# Patient Record
Sex: Female | Born: 1965 | Race: Black or African American | Hispanic: No | Marital: Single | Smoking: Never smoker
Health system: Southern US, Community
[De-identification: ages and names within clinical notes are randomized; demographics above are authoritative.]

## PROBLEM LIST (undated history)

## (undated) DIAGNOSIS — M109 Gout, unspecified: Secondary | ICD-10-CM

## (undated) DIAGNOSIS — I10 Essential (primary) hypertension: Secondary | ICD-10-CM

---

## 2014-10-19 ENCOUNTER — Emergency Department (HOSPITAL_BASED_OUTPATIENT_CLINIC_OR_DEPARTMENT_OTHER): Payer: BLUE CROSS/BLUE SHIELD

## 2014-10-19 ENCOUNTER — Encounter (HOSPITAL_BASED_OUTPATIENT_CLINIC_OR_DEPARTMENT_OTHER): Payer: Self-pay

## 2014-10-19 ENCOUNTER — Emergency Department (HOSPITAL_BASED_OUTPATIENT_CLINIC_OR_DEPARTMENT_OTHER)
Admission: EM | Admit: 2014-10-19 | Discharge: 2014-10-19 | Disposition: A | Discharge status: Home / Self Care | Payer: BLUE CROSS/BLUE SHIELD | Attending: Emergency Medicine | Admitting: Emergency Medicine

## 2014-10-19 DIAGNOSIS — Y9389 Activity, other specified: Secondary | ICD-10-CM | POA: Diagnosis not present

## 2014-10-19 DIAGNOSIS — S60222A Contusion of left hand, initial encounter: Secondary | ICD-10-CM | POA: Diagnosis not present

## 2014-10-19 DIAGNOSIS — Y9289 Other specified places as the place of occurrence of the external cause: Secondary | ICD-10-CM | POA: Diagnosis not present

## 2014-10-19 DIAGNOSIS — W500XXA Accidental hit or strike by another person, initial encounter: Secondary | ICD-10-CM | POA: Insufficient documentation

## 2014-10-19 DIAGNOSIS — S6992XA Unspecified injury of left wrist, hand and finger(s), initial encounter: Secondary | ICD-10-CM | POA: Diagnosis present

## 2014-10-19 DIAGNOSIS — Y998 Other external cause status: Secondary | ICD-10-CM | POA: Insufficient documentation

## 2014-10-19 MED ORDER — NAPROXEN 500 MG PO TABS: 500.0000 mg | ORAL_TABLET | Freq: Two times a day (BID) | ORAL | Status: DC

## 2014-10-19 NOTE — Discharge Instructions (Signed)
Keep ace wrap on for swelling and for support. Ice your hand several times a day. Elevate. Naprosyn for pain and inflammation. Follow up with your doctor if not improving.   Hand Contusion A hand contusion is a deep bruise on your hand area. Contusions are the result of an injury that caused bleeding under the skin. The contusion may turn blue, purple, or yellow. Minor injuries will give you a painless contusion, but more severe contusions may stay painful and swollen for a few weeks. CAUSES  A contusion is usually caused by a blow, trauma, or direct force to an area of the body. SYMPTOMS   Swelling and redness of the injured area.  Discoloration of the injured area.  Tenderness and soreness of the injured area.  Pain. DIAGNOSIS  The diagnosis can be made by taking a history and performing a physical exam. An X-ray, CT scan, or MRI may be needed to determine if there were any associated injuries, such as broken bones (fractures). TREATMENT  Often, the best treatment for a hand contusion is resting, elevating, icing, and applying cold compresses to the injured area. Over-the-counter medicines may also be recommended for pain control. HOME CARE INSTRUCTIONS   Put ice on the injured area.  Put ice in a plastic bag.  Place a towel between your skin and the bag.  Leave the ice on for 15-20 minutes, 03-04 times a day.  Only take over-the-counter or prescription medicines as directed by your caregiver. Your caregiver may recommend avoiding anti-inflammatory medicines (aspirin, ibuprofen, and naproxen) for 48 hours because these medicines may increase bruising.  If told, use an elastic wrap as directed. This can help reduce swelling. You may remove the wrap for sleeping, showering, and bathing. If your fingers become numb, cold, or blue, take the wrap off and reapply it more loosely.  Elevate your hand with pillows to reduce swelling.  Avoid overusing your hand if it is painful. SEEK  IMMEDIATE MEDICAL CARE IF:   You have increased redness, swelling, or pain in your hand.  Your swelling or pain is not relieved with medicines.  You have loss of feeling in your hand or are unable to move your fingers.  Your hand turns cold or blue.  You have pain when you move your fingers.  Your hand becomes warm to the touch.  Your contusion does not improve in 2 days. MAKE SURE YOU:   Understand these instructions.  Will watch your condition.  Will get help right away if you are not doing well or get worse. Document Released: 12/25/2001 Document Revised: 03/29/2012 Document Reviewed: 12/27/2011 [facility] Patient Information 2015 [place], [place]. This information is not intended to replace advice given to you by your health care provider. Make sure you discuss any questions you have with your health care provider.
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
## 2015-12-19 ENCOUNTER — Encounter (HOSPITAL_BASED_OUTPATIENT_CLINIC_OR_DEPARTMENT_OTHER): Payer: Self-pay | Admitting: *Deleted

## 2015-12-19 ENCOUNTER — Emergency Department (HOSPITAL_BASED_OUTPATIENT_CLINIC_OR_DEPARTMENT_OTHER): Payer: BLUE CROSS/BLUE SHIELD

## 2015-12-19 ENCOUNTER — Emergency Department (HOSPITAL_BASED_OUTPATIENT_CLINIC_OR_DEPARTMENT_OTHER)
Admission: EM | Admit: 2015-12-19 | Discharge: 2015-12-19 | Disposition: A | Discharge status: Home / Self Care | Payer: BLUE CROSS/BLUE SHIELD | Attending: Emergency Medicine | Admitting: Emergency Medicine

## 2015-12-19 DIAGNOSIS — R05 Cough: Secondary | ICD-10-CM

## 2015-12-19 DIAGNOSIS — R059 Cough, unspecified: Secondary | ICD-10-CM

## 2015-12-19 DIAGNOSIS — J040 Acute laryngitis: Secondary | ICD-10-CM | POA: Insufficient documentation

## 2015-12-19 LAB — CBC WITH DIFFERENTIAL/PLATELET
Basophils Absolute: 0 10*3/uL (ref 0.0–0.1)
Basophils Relative: 0 %
EOS PCT: 2 %
Eosinophils Absolute: 0.1 10*3/uL (ref 0.0–0.7)
HCT: 36.7 % (ref 36.0–46.0)
HEMOGLOBIN: 12.5 g/dL (ref 12.0–15.0)
LYMPHS ABS: 2.9 10*3/uL (ref 0.7–4.0)
LYMPHS PCT: 47 %
MCH: 31.8 pg (ref 26.0–34.0)
MCHC: 34.1 g/dL (ref 30.0–36.0)
MCV: 93.4 fL (ref 78.0–100.0)
Monocytes Absolute: 0.6 10*3/uL (ref 0.1–1.0)
Monocytes Relative: 10 %
Neutro Abs: 2.5 10*3/uL (ref 1.7–7.7)
Neutrophils Relative %: 41 %
PLATELETS: 251 10*3/uL (ref 150–400)
RBC: 3.93 MIL/uL (ref 3.87–5.11)
RDW: 13.1 % (ref 11.5–15.5)
WBC: 6 10*3/uL (ref 4.0–10.5)

## 2015-12-19 LAB — BASIC METABOLIC PANEL
Anion gap: 7 (ref 5–15)
BUN: 8 mg/dL (ref 6–20)
CHLORIDE: 105 mmol/L (ref 101–111)
CO2: 26 mmol/L (ref 22–32)
Calcium: 8.2 mg/dL — ABNORMAL LOW (ref 8.9–10.3)
Creatinine, Ser: 0.9 mg/dL (ref 0.44–1.00)
GFR calc Af Amer: >60 mL/min (ref >60)
GFR calc non Af Amer: >60 mL/min (ref >60)
Glucose, Bld: 97 mg/dL (ref 65–99)
POTASSIUM: 3.1 mmol/L — AB (ref 3.5–5.1)
Sodium: 138 mmol/L (ref 135–145)

## 2015-12-19 LAB — BRAIN NATRIURETIC PEPTIDE: B Natriuretic Peptide: 22.2 pg/mL (ref 0.0–100.0)

## 2015-12-19 MED ORDER — POTASSIUM CHLORIDE CRYS ER 20 MEQ PO TBCR
40.0000 meq | EXTENDED_RELEASE_TABLET | Freq: Once | ORAL | Status: AC
Start: 2015-12-19 — End: 2015-12-19
  Administered 2015-12-19: 40 meq via ORAL
  Filled 2015-12-19: qty 2

## 2015-12-19 MED ORDER — DEXAMETHASONE SODIUM PHOSPHATE 10 MG/ML IJ SOLN
10.0000 mg | Freq: Once | INTRAMUSCULAR | Status: AC
  Administered 2015-12-19: 10 mg via INTRAMUSCULAR
  Filled 2015-12-19: qty 1

## 2015-12-19 NOTE — Discharge Instructions (Signed)
Laryngitis Laryngitis is inflammation of your vocal cords. This causes hoarseness, coughing, loss of voice, sore throat, or a dry throat. Your vocal cords are two bands of muscles that are found in your throat. When you speak, these cords come together and vibrate. These vibrations come out through your mouth as sound. When your vocal cords are inflamed, your voice sounds different. Laryngitis can be temporary (acute) or long-term (chronic). Most cases of acute laryngitis improve with time. Chronic laryngitis is laryngitis that lasts for more than three weeks. CAUSES Acute laryngitis may be caused by:  A viral infection.  Lots of talking, yelling, or singing. This is also called vocal strain.  Bacterial infections. Chronic laryngitis may be caused by:  Vocal strain.  Injury to your vocal cords.  Acid reflux (gastroesophageal reflux disease or GERD).  Allergies.  Sinus infection.  Smoking.  Alcohol abuse.  Breathing in chemicals or dust.  Growths on the vocal cords. RISK FACTORS Risk factors for laryngitis include:  Smoking.  Alcohol abuse.  Having allergies. SIGNS AND SYMPTOMS Symptoms of laryngitis may include:  Low, hoarse voice.  Loss of voice.  Dry cough.  Sore throat.  Stuffy nose. DIAGNOSIS Laryngitis may be diagnosed by:  Physical exam.  Throat culture.  Blood test.  Laryngoscopy. This procedure allows your health care provider to look at your vocal cords with a mirror or viewing tube. TREATMENT Treatment for laryngitis depends on what is causing it. Usually, treatment involves resting your voice and using medicines to soothe your throat. However, if your laryngitis is caused by a bacterial infection, you may need to take antibiotic medicine. If your laryngitis is caused by a growth, you may need to have a procedure to remove it. HOME CARE INSTRUCTIONS  Drink enough fluid to keep your urine clear or pale yellow.  Breathe in moist air. Use a  humidifier if you live in a dry climate.  Take medicines only as directed by your health care provider.  If you were prescribed an antibiotic medicine, finish it all even if you start to feel better.  Do not smoke cigarettes or electronic cigarettes. If you need help quitting, ask your health care provider.  Talk as little as possible. Also avoid whispering, which can cause vocal strain.  Write instead of talking. Do this until your voice is back to normal. SEEK MEDICAL CARE IF:  You have a fever.  You have increasing pain.  You have difficulty swallowing. SEEK IMMEDIATE MEDICAL CARE IF:  You cough up blood.  You have trouble breathing.   This information is not intended to replace advice given to you by your health care provider. Make sure you discuss any questions you have with your health care provider.   Document Released: 07/05/2005 Document Revised: 07/26/2014 Document Reviewed: 12/18/2013 Elsevier Interactive Patient Education 2016 Elsevier Inc.    Cough, Adult Coughing is a reflex that clears your throat and your airways. Coughing helps to heal and protect your lungs. It is normal to cough occasionally, but a cough that happens with other symptoms or lasts a long time may be a sign of a condition that needs treatment. A cough may last only 2-3 weeks (acute), or it may last longer than 8 weeks (chronic). CAUSES Coughing is commonly caused by:  Breathing in substances that irritate your lungs.  A viral or bacterial respiratory infection.  Allergies.  Asthma.  Postnasal drip.  Smoking.  Acid backing up from the stomach into the esophagus (gastroesophageal reflux).  Certain medicines.    Chronic lung problems, including COPD (or rarely, lung cancer).  Other medical conditions such as heart failure. HOME CARE INSTRUCTIONS  Pay attention to any changes in your symptoms. Take these actions to help with your discomfort:  Take medicines only as told by your  health care provider.  If you were prescribed an antibiotic medicine, take it as told by your health care provider. Do not stop taking the antibiotic even if you start to feel better.  Talk with your health care provider before you take a cough suppressant medicine.  Drink enough fluid to keep your urine clear or pale yellow.  If the air is dry, use a cold steam vaporizer or humidifier in your bedroom or your home to help loosen secretions.  Avoid anything that causes you to cough at work or at home.  If your cough is worse at night, try sleeping in a semi-upright position.  Avoid cigarette smoke. If you smoke, quit smoking. If you need help quitting, ask your health care provider.  Avoid caffeine.  Avoid alcohol.  Rest as needed. SEEK MEDICAL CARE IF:   You have new symptoms.  You cough up pus.  Your cough does not get better after 2-3 weeks, or your cough gets worse.  You cannot control your cough with suppressant medicines and you are losing sleep.  You develop pain that is getting worse or pain that is not controlled with pain medicines.  You have a fever.  You have unexplained weight loss.  You have night sweats. SEEK IMMEDIATE MEDICAL CARE IF:  You cough up blood.  You have difficulty breathing.  Your heartbeat is very fast.   This information is not intended to replace advice given to you by your health care provider. Make sure you discuss any questions you have with your health care provider.   Document Released: 01/01/2011 Document Revised: 03/26/2015 Document Reviewed: 09/11/2014 Elsevier Interactive Patient Education 2016 Elsevier Inc.  

## 2015-12-19 NOTE — ED Notes (Signed)
Cough and laryngitis.

## 2015-12-19 NOTE — ED Notes (Signed)
Pt tearful discussing recent death of husband ( 9 months ago)and sudden death of mother in law this week and its impact in her health . She reports feeling tearful, depressed and lacking in support. Pt reports attending hospice meeting that do not help. Pt is a [employer], discussed an action plan to seek counseling from her church,keeping a  journal and leaning on support of friends and family.

## 2015-12-19 NOTE — ED Provider Notes (Signed)
CSN: [id number]     Arrival date & time 12/19/15  1746 History   First MD Initiated Contact with Patient 12/19/15 1810     Chief Complaint  Patient presents with  . Cough   HPI  Ms. Kaitlin Haney is a 50 year old female presenting with cough and hoarseness. Pt reports onset of symptoms approximately 1 week ago. She complains of a hoarse voice without sore throat. Denies pain on swallowing, difficulty swallowing or difficulty breathing. She also complains of a productive cough with yellow phlegm. She states that the cough was dry at first before developing sputum. The cough appeared first and she believes her frequent coughing caused the hoarseness. She endorses associated clear rhinorrhea. She has not tried any OTC medications. She has tried voice rest for her throat. 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 Denies sick contacts. Denies fevers, chills, headaches, ear pain, eye discharge, nasal congestion, sinus pressure, sore throat, neck pain, chest pain, SOB, abdominal pain, nausea or vomiting.   History reviewed. No pertinent past medical history. History reviewed. No pertinent past surgical history. No family history on file. Social History  Substance Use Topics  . Smoking status: Never Smoker   . Smokeless tobacco: None  . Alcohol Use: Yes   OB History    No data available     Review of Systems  All other systems reviewed and are negative.     Allergies  Review of patient's allergies indicates no known allergies.  Home Medications   Prior to Admission medications   Medication Sig Start Date End Date Taking? Authorizing Provider  naproxen (NAPROSYN) 500 MG tablet Take 1 tablet (500 mg total) by mouth 2 (two) times daily. 10/19/14   Tatyana Kirichenko, PA-C   BP 174/89 mmHg  Pulse 65  Temp(Src) 98.6 F (37 C) (Oral)  Resp 14  Ht 5\' 9"  (1.753 m)  Wt 136.079 kg  BMI 44.28 kg/m2  SpO2 100%  LMP 12/14/2015 Physical Exam   Constitutional: She appears well-developed and well-nourished. No distress.  Nontoxic appearing  HENT:  Head: Normocephalic and atraumatic.  Nose: No mucosal edema or rhinorrhea.  Mouth/Throat: Uvula is midline, oropharynx is clear and moist and mucous membranes are normal. No oropharyngeal exudate, posterior oropharyngeal edema or posterior oropharyngeal erythema.  Hoarse  Eyes: Conjunctivae are normal. Right eye exhibits no discharge. Left eye exhibits no discharge. No scleral icterus.  Neck: Normal range of motion. Neck supple.  FROM intact. No cervical adenopathy.    Cardiovascular: Normal rate, regular rhythm and normal heart sounds.   Pulmonary/Chest: Effort normal and breath sounds normal. No stridor. No respiratory distress. She has no wheezes. She has no rales.  Musculoskeletal: Normal range of motion.  Lymphadenopathy:    She has no cervical adenopathy.  Neurological: She is alert. Coordination normal.  Skin: Skin is warm and dry.  Psychiatric: She has a normal mood and affect. Her behavior is normal.  Nursing note and vitals reviewed.   ED Course  Procedures (including critical care time) Labs Review Labs Reviewed  BASIC METABOLIC PANEL - Abnormal; Notable for the following:    Potassium 3.1 (*)    Calcium 8.2 (*)    All other components within normal limits  CBC WITH DIFFERENTIAL/PLATELET  BRAIN NATRIURETIC PEPTIDE    Imaging Review Dg Chest 2 View  12/19/2015  CLINICAL DATA:  Acute onset of dry hacking cough. Initial encounter. EXAM: CHEST  2 VIEW COMPARISON:  None. FINDINGS: The  lungs are well-aerated. Vascular congestion is noted, with minimal bilateral atelectasis. There is no evidence of pleural effusion or pneumothorax. The heart is normal in size; the mediastinal contour is within normal limits. No acute osseous abnormalities are seen. IMPRESSION: Vascular congestion, with minimal bilateral atelectasis. Electronically Signed   By: Roanna Raider M.D.   On:  12/19/2015 18:48   I have personally reviewed and evaluated these images and lab results as part of my medical decision-making.   EKG Interpretation None      MDM   Final diagnoses:  Cough  Laryngitis   50 year old female presenting with cough and hoarseness x 1 week. Afebrile and hemodynamically stable. Nontoxic appearing. No oropharyngeal erythema, edema or exudate. No cervical adenopathy. Lungs CTAB. CXR with vascular congestion. Pt denies personal hx of CHF or heart disease. Does note that mother has a pacemaker. Given dry cough and congestion and lack of PCP; further work up indicated before discharge.  Screening blood work unremarkable. Potassium 3.1 which was repleted in ED. BNP WNL. ECG non-ischemic. Presentation likely URI with laryngitis. Decadron given IM. Discussed symptomatic care with patient. Strongly encouraged pt to establish care with PCP.  At this time there does not appear to be any evidence of an acute emergency medical condition and the patient appears stable for discharge with appropriate outpatient follow up. Diagnosis was discussed with patient who verbalizes understanding and is agreeable to discharge. Pt case discussed with Dr. Clayborne Dana who agrees with my plan. Return precautions given in discharge paperwork and discussed with pt at bedside. Pt is stable for discharge.     Rolm Gala Montavis Schubring, PA-C 12/19/15 2204  Marily Memos, MD 12/19/15 [phone]

## 2019-05-18 ENCOUNTER — Other Ambulatory Visit: Payer: Self-pay

## 2019-05-18 DIAGNOSIS — Z20822 Contact with and (suspected) exposure to covid-19: Secondary | ICD-10-CM

## 2019-05-20 ENCOUNTER — Telehealth: Payer: Self-pay

## 2019-05-20 LAB — NOVEL CORONAVIRUS, NAA: SARS-CoV-2, NAA: NOT DETECTED

## 2019-05-20 NOTE — Telephone Encounter (Signed)
Patient called in requesting MyChart setup assistance and COVID 19 lab results - DOB/Address verified - results pending, explained testing process, assisted with MyChart setup, no further questions.

## 2019-07-07 ENCOUNTER — Emergency Department (HOSPITAL_BASED_OUTPATIENT_CLINIC_OR_DEPARTMENT_OTHER)
Admission: EM | Admit: 2019-07-07 | Discharge: 2019-07-07 | Disposition: A | Discharge status: Home / Self Care | Payer: BLUE CROSS/BLUE SHIELD | Attending: Emergency Medicine | Admitting: Emergency Medicine

## 2019-07-07 ENCOUNTER — Other Ambulatory Visit: Payer: Self-pay

## 2019-07-07 ENCOUNTER — Encounter (HOSPITAL_BASED_OUTPATIENT_CLINIC_OR_DEPARTMENT_OTHER): Payer: Self-pay | Admitting: Emergency Medicine

## 2019-07-07 DIAGNOSIS — I10 Essential (primary) hypertension: Secondary | ICD-10-CM

## 2019-07-07 DIAGNOSIS — R42 Dizziness and giddiness: Secondary | ICD-10-CM | POA: Insufficient documentation

## 2019-07-07 HISTORY — DX: Essential (primary) hypertension: I10

## 2019-07-07 LAB — BASIC METABOLIC PANEL
Anion gap: 10 (ref 5–15)
BUN: 11 mg/dL (ref 6–20)
CO2: 25 mmol/L (ref 22–32)
Calcium: 8.6 mg/dL — ABNORMAL LOW (ref 8.9–10.3)
Chloride: 104 mmol/L (ref 98–111)
Creatinine, Ser: 0.92 mg/dL (ref 0.44–1.00)
GFR calc Af Amer: >60 mL/min (ref >60)
GFR calc non Af Amer: >60 mL/min (ref >60)
Glucose, Bld: 105 mg/dL — ABNORMAL HIGH (ref 70–99)
Potassium: 3.8 mmol/L (ref 3.5–5.1)
Sodium: 139 mmol/L (ref 135–145)

## 2019-07-07 LAB — URINALYSIS, ROUTINE W REFLEX MICROSCOPIC
Bilirubin Urine: NEGATIVE
Glucose, UA: NEGATIVE mg/dL
Hgb urine dipstick: NEGATIVE
Ketones, ur: NEGATIVE mg/dL
Leukocytes,Ua: NEGATIVE
Nitrite: NEGATIVE
Protein, ur: NEGATIVE mg/dL
Specific Gravity, Urine: >1.03 — ABNORMAL HIGH (ref 1.005–1.030)
pH: 6 (ref 5.0–8.0)

## 2019-07-07 LAB — CBC
HCT: 38.7 % (ref 36.0–46.0)
Hemoglobin: 12.7 g/dL (ref 12.0–15.0)
MCH: 31.4 pg (ref 26.0–34.0)
MCHC: 32.8 g/dL (ref 30.0–36.0)
MCV: 95.6 fL (ref 80.0–100.0)
Platelets: 272 10*3/uL (ref 150–400)
RBC: 4.05 MIL/uL (ref 3.87–5.11)
RDW: 14.3 % (ref 11.5–15.5)
WBC: 7 10*3/uL (ref 4.0–10.5)
nRBC: 0 % (ref 0.0–0.2)

## 2019-07-07 MED ORDER — AMLODIPINE BESYLATE 5 MG PO TABS
5.0000 mg | ORAL_TABLET | Freq: Once | ORAL | Status: AC
  Administered 2019-07-07: 5 mg via ORAL
  Filled 2019-07-07: qty 1

## 2019-07-07 MED ORDER — AMLODIPINE BESYLATE 5 MG PO TABS: 5.0000 mg | ORAL_TABLET | Freq: Every day | ORAL | 0 refills | Status: AC

## 2019-07-07 NOTE — ED Triage Notes (Signed)
Pt reports dizziness and palpitations for a few hours. Denies chest pain, SOB

## 2019-07-07 NOTE — ED Notes (Signed)
Patient verbalizes understanding of discharge instructions. Opportunity for questioning and answers were provided. Armband removed by staff, pt discharged from ED.  

## 2019-07-07 NOTE — ED Provider Notes (Signed)
[facility] EMERGENCY DEPARTMENT Provider Note   CSN: [id number] Arrival date & time: 07/07/19  1115     History Chief Complaint  Patient presents with  . Dizziness  . Palpitations    Kaitlin Haney is a 53 y.o. female.  The history is provided by the patient and medical records. No language interpreter was used.  Dizziness Associated symptoms: palpitations   Palpitations Associated symptoms: dizziness    Kaitlin Haney is a 53 y.o. female who presents to the Emergency Department complaining of feeling bad.  She presents to the ED for feeling poorly that started today.  She has generalized weakness.  She has been out of her blood pressure medication for the last week and thinks her BP might be high.  She feels dizzy.  Denies fever, cough, chest pain, sob, HA, vomiting, diarrhea.  No loss of taste or smell.  Had a negative covid swab 3 weeks ago.  Works as a Lawyer.  No known COVID exposures.   She has mild nausea.    Takes hctz 12.5    Past Medical History:  Diagnosis Date  . Hypertension     There are no problems to display for this patient.   No past surgical history on file.   OB History   No obstetric history on file.     No family history on file.  Social History   Tobacco Use  . Smoking status: Never Smoker  . Smokeless tobacco: Never Used  Substance Use Topics  . Alcohol use: Yes  . Drug use: No    Home Medications Prior to Admission medications   Medication Sig Start Date End Date Taking? Authorizing Provider  amLODipine (NORVASC) 5 MG tablet Take 1 tablet (5 mg total) by mouth daily. 07/07/19   Tilden Fossa, MD  naproxen (NAPROSYN) 500 MG tablet Take 1 tablet (500 mg total) by mouth 2 (two) times daily. 10/19/14   Jaynie Crumble, PA-C    Allergies    Patient has no known allergies.  Review of Systems   Review of Systems  Cardiovascular: Positive for palpitations.  Neurological: Positive for dizziness.  All other systems  reviewed and are negative.   Physical Exam Updated Vital Signs BP (!) 185/101 (BP Location: Left Arm)   Pulse 65   Temp 97.9 F (36.6 C) (Oral)   Resp 18   Ht 5\' 9"  (1.753 m)   Wt (!) 142.9 kg   SpO2 99%   BMI 46.52 kg/m   Physical Exam Vitals and nursing note reviewed.  Constitutional:      Appearance: She is well-developed.  HENT:     Head: Normocephalic and atraumatic.  Cardiovascular:     Rate and Rhythm: Normal rate and regular rhythm.     Heart sounds: No murmur.  Pulmonary:     Effort: Pulmonary effort is normal. No respiratory distress.     Breath sounds: Normal breath sounds.  Abdominal:     Palpations: Abdomen is soft.     Tenderness: There is no abdominal tenderness. There is no guarding or rebound.  Musculoskeletal:        General: No swelling or tenderness.  Skin:    General: Skin is warm and dry.  Neurological:     Mental Status: She is alert and oriented to person, place, and time.     Comments: PERRL, EOMI, no assymmetry of facial movements.  5/5 strength in all four extremities.    Psychiatric:        Behavior:  Behavior normal.     ED Results / Procedures / Treatments   Labs (all labs ordered are listed, but only abnormal results are displayed) Labs Reviewed  BASIC METABOLIC PANEL - Abnormal; Notable for the following components:      Result Value   Glucose, Bld 105 (*)    Calcium 8.6 (*)    All other components within normal limits  URINALYSIS, ROUTINE W REFLEX MICROSCOPIC - Abnormal; Notable for the following components:   APPearance HAZY (*)    Specific Gravity, Urine >1.030 (*)    All other components within normal limits  CBC    EKG EKG Interpretation  Date/Time:  Saturday July 07 2019 11:21:40 EST Ventricular Rate:  67 PR Interval:  160 QRS Duration: 86 QT Interval:  398 QTC Calculation: 420 R Axis:   54 Text Interpretation: Normal sinus rhythm Normal ECG Confirmed by Quintella Reichert [id number]) on 07/07/2019 11:50:42  AM   Radiology No results found.  Procedures Procedures (including critical care time)  Medications Ordered in ED Medications  amLODipine (NORVASC) tablet 5 mg (has no administration in time range)    ED Course  I have reviewed the triage vital signs and the nursing notes.  Pertinent labs & imaging results that were available during my care of the patient were reviewed by me and considered in my medical decision making (see chart for details).    MDM Rules/Calculators/A&P                      Patient here for evaluation of dizziness, out of her blood pressure medications. Her dizziness is overall improving. She has no ataxia on walking in the room. She is non-toxic appearing. Presentation is not consistent with subarachnoid hemorrhage, CVA, hypertensive emergency. Labs demonstrate stable renal function. Counseled patient on home care for dizziness as well as essential hypertension. Discussed outpatient follow-up and return precautions.  Kaitlin Haney was evaluated in Emergency Department on 07/07/2019 for the symptoms described in the history of present illness. She was evaluated in the context of the global COVID-19 pandemic, which necessitated consideration that the patient might be at risk for infection with the SARS-CoV-2 virus that causes COVID-19. Institutional protocols and algorithms that pertain to the evaluation of patients at risk for COVID-19 are in a state of rapid change based on information released by regulatory bodies including the CDC and federal and state organizations. These policies and algorithms were followed during the patient's care in the ED.  Final Clinical Impression(s) / ED Diagnoses Final diagnoses:  Dizziness  Essential hypertension    Rx / DC Orders ED Discharge Orders         Ordered    amLODipine (NORVASC) 5 MG tablet  Daily     07/07/19 1242           Quintella Reichert, MD 07/07/19 1248

## 2019-07-07 NOTE — ED Notes (Signed)
Urine specimen collection cup provided. Pt states unable to provide urine specimen at this time

## 2020-01-31 ENCOUNTER — Emergency Department (HOSPITAL_BASED_OUTPATIENT_CLINIC_OR_DEPARTMENT_OTHER): Payer: Self-pay

## 2020-01-31 ENCOUNTER — Encounter (HOSPITAL_BASED_OUTPATIENT_CLINIC_OR_DEPARTMENT_OTHER): Payer: Self-pay | Admitting: Emergency Medicine

## 2020-01-31 ENCOUNTER — Other Ambulatory Visit: Payer: Self-pay

## 2020-01-31 ENCOUNTER — Emergency Department (HOSPITAL_BASED_OUTPATIENT_CLINIC_OR_DEPARTMENT_OTHER)
Admission: EM | Admit: 2020-01-31 | Discharge: 2020-01-31 | Disposition: A | Discharge status: Home / Self Care | Payer: Self-pay | Attending: Emergency Medicine | Admitting: Emergency Medicine

## 2020-01-31 DIAGNOSIS — S60221A Contusion of right hand, initial encounter: Secondary | ICD-10-CM | POA: Insufficient documentation

## 2020-01-31 DIAGNOSIS — W01190A Fall on same level from slipping, tripping and stumbling with subsequent striking against furniture, initial encounter: Secondary | ICD-10-CM | POA: Insufficient documentation

## 2020-01-31 DIAGNOSIS — Y9289 Other specified places as the place of occurrence of the external cause: Secondary | ICD-10-CM | POA: Insufficient documentation

## 2020-01-31 DIAGNOSIS — Y990 Civilian activity done for income or pay: Secondary | ICD-10-CM | POA: Insufficient documentation

## 2020-01-31 DIAGNOSIS — Z79899 Other long term (current) drug therapy: Secondary | ICD-10-CM | POA: Insufficient documentation

## 2020-01-31 DIAGNOSIS — Y9389 Activity, other specified: Secondary | ICD-10-CM | POA: Insufficient documentation

## 2020-01-31 DIAGNOSIS — I10 Essential (primary) hypertension: Secondary | ICD-10-CM | POA: Insufficient documentation

## 2020-01-31 MED ORDER — IBUPROFEN 400 MG PO TABS
600.0000 mg | ORAL_TABLET | Freq: Once | ORAL | Status: AC
  Administered 2020-01-31: 600 mg via ORAL
  Filled 2020-01-31: qty 1

## 2020-01-31 MED ORDER — HYDROCODONE-ACETAMINOPHEN 5-325 MG PO TABS
1.0000 | ORAL_TABLET | Freq: Once | ORAL | Status: AC
  Administered 2020-01-31: 1 via ORAL
  Filled 2020-01-31: qty 1

## 2020-01-31 MED ORDER — NAPROXEN 500 MG PO TABS: 500.0000 mg | ORAL_TABLET | Freq: Two times a day (BID) | ORAL | 0 refills | Status: DC

## 2020-01-31 NOTE — Discharge Instructions (Signed)
Follow-up with your primary care provider. Take the medications to help with your pain and swelling. Apply ice to the area. Return to the ER for worsening pain, swelling, additional injuries, numbness or weakness.

## 2020-01-31 NOTE — ED Triage Notes (Signed)
Tripped and fell over a cord at work yesterday. C/o pain and swelling to R hand.

## 2020-01-31 NOTE — ED Provider Notes (Addendum)
[facility] EMERGENCY DEPARTMENT Provider Note   CSN: [id number] Arrival date & time: 01/31/20  1004     History Chief Complaint  Patient presents with  . Fall    Michal Strzelecki is a 54 y.o. female with a past medical history of hypertension presenting to the ED with a chief complaint of dominant right hand pain.  States that she tripped over a cord at work yesterday and hit her right hand on a wheelchair.  She denies any head injury or loss of consciousness.  She reports progressive worsening pain and swelling at the medial right hand at the dorsum of the hand.  She has been taking ibuprofen and Tylenol with only minimal improvement in her symptoms.  She denies any prior fracture, dislocations or procedures in the area, numbness, weakness or other injuries.  HPI     Past Medical History:  Diagnosis Date  . Hypertension     There are no problems to display for this patient.   History reviewed. No pertinent surgical history.   OB History   No obstetric history on file.     No family history on file.  Social History   Tobacco Use  . Smoking status: Never Smoker  . Smokeless tobacco: Never Used  Substance Use Topics  . Alcohol use: Yes  . Drug use: No    Home Medications Prior to Admission medications   Medication Sig Start Date End Date Taking? Authorizing Provider  amLODipine (NORVASC) 5 MG tablet Take 1 tablet (5 mg total) by mouth daily. 07/07/19   Tilden Fossa, MD  naproxen (NAPROSYN) 500 MG tablet Take 1 tablet (500 mg total) by mouth 2 (two) times daily. 01/31/20   Dietrich Pates, PA-C    Allergies    Patient has no known allergies.  Review of Systems   Review of Systems  Constitutional: Negative for chills and fever.  Musculoskeletal: Positive for arthralgias.  Skin: Negative for wound.  Neurological: Negative for weakness and numbness.    Physical Exam Updated Vital Signs BP 137/90 (BP Location: Left Arm)   Pulse 78   Temp 98.2 F  (36.8 C) (Oral)   Resp 16   Ht 5\' 9"  (1.753 m)   Wt 130.2 kg   LMP 12/14/2015   SpO2 97%   BMI 42.38 kg/m   Physical Exam Vitals and nursing note reviewed.  Constitutional:      General: She is not in acute distress.    Appearance: She is well-developed. She is not diaphoretic.  HENT:     Head: Normocephalic and atraumatic.  Eyes:     General: No scleral icterus.    Conjunctiva/sclera: Conjunctivae normal.  Pulmonary:     Effort: Pulmonary effort is normal. No respiratory distress.  Musculoskeletal:        General: Tenderness present.       Hands:     Cervical back: Normal range of motion.     Comments: Tenderness and swelling in the indicated area.  Normal range of motion of digits.  Normal sensation to light touch.  Compartments are soft.  2+ radial pulses noted bilaterally.  No deformity.  Skin:    Findings: No rash.  Neurological:     Mental Status: She is alert.     ED Results / Procedures / Treatments   Labs (all labs ordered are listed, but only abnormal results are displayed) Labs Reviewed - No data to display  EKG None  Radiology DG Hand Complete Right  Result Date:  01/31/2020 CLINICAL DATA:  Pain following fall EXAM: RIGHT HAND - COMPLETE 3+ VIEW COMPARISON:  None FINDINGS: Frontal, oblique, and lateral views were obtained. No fracture or dislocation. There is no joint space narrowing or erosion. There are benign cystic areas in the proximal to mid scaphoid bone. IMPRESSION: No fracture or dislocation. No joint space narrowing. Benign cystic areas in the scaphoid bone. Electronically Signed   By: Bretta Bang III M.D.   On: 01/31/2020 10:43    Procedures Procedures (including critical care time)  Medications Ordered in ED Medications  ibuprofen (ADVIL) tablet 600 mg (has no administration in time range)  HYDROcodone-acetaminophen (NORCO/VICODIN) 5-325 MG per tablet 1 tablet (has no administration in time range)    ED Course  I have reviewed  the triage vital signs and the nursing notes.  Pertinent labs & imaging results that were available during my care of the patient were reviewed by me and considered in my medical decision making (see chart for details).    MDM Rules/Calculators/A&P                          54 year old female presenting to the ED with dominant right hand pain after fall yesterday.  States that she hit her right hand on a wheelchair after she tripped on a cord.  Denies any other injuries or loss of consciousness.  There is swelling and tenderness of the right hand as indicated in the image.  Normal range of motion, normal sensation, no weakness noted.  Area is neurovascularly intact.  Good distal pulses noted.  X-ray shows no acute abnormality of the bones.  Suspect that symptoms are due to contusion. Doubt infectious or vascular cause.  We will have her apply Ace wrap, apply ice to the area and NSAIDs to help with swelling.  Return precautions given.  All imaging, if done today, including plain films, CT scans, and ultrasounds, independently reviewed by me, and interpretations confirmed via formal radiology reads.  Patient is hemodynamically stable, in NAD, and able to ambulate in the ED. Evaluation does not show pathology that would require ongoing emergent intervention or inpatient treatment. I explained the diagnosis to the patient. Pain has been managed and has no complaints prior to discharge. Patient is comfortable with above plan and is stable for discharge at this time. All questions were answered prior to disposition. Strict return precautions for returning to the ED were discussed. Encouraged follow up with PCP.   An After Visit Summary was printed and given to the patient.   Portions of this note were generated with Scientist, clinical (histocompatibility and immunogenetics). Dictation errors may occur despite best attempts at proofreading.  Final Clinical Impression(s) / ED Diagnoses Final diagnoses:  Contusion of right hand, initial  encounter    Rx / DC Orders ED Discharge Orders         Ordered    naproxen (NAPROSYN) 500 MG tablet  2 times daily     Discontinue  Reprint     01/31/20 1119            Dietrich Pates, PA-C 01/31/20 1119    Pollyann Savoy, MD 01/31/20 1452

## 2021-12-22 ENCOUNTER — Emergency Department (HOSPITAL_BASED_OUTPATIENT_CLINIC_OR_DEPARTMENT_OTHER)
Admission: EM | Admit: 2021-12-22 | Discharge: 2021-12-22 | Disposition: A | Discharge status: Home / Self Care | Payer: Commercial Managed Care - HMO | Attending: Emergency Medicine | Admitting: Emergency Medicine

## 2021-12-22 ENCOUNTER — Other Ambulatory Visit: Payer: Self-pay

## 2021-12-22 ENCOUNTER — Encounter (HOSPITAL_BASED_OUTPATIENT_CLINIC_OR_DEPARTMENT_OTHER): Payer: Self-pay | Admitting: Emergency Medicine

## 2021-12-22 ENCOUNTER — Emergency Department (HOSPITAL_BASED_OUTPATIENT_CLINIC_OR_DEPARTMENT_OTHER): Payer: Commercial Managed Care - HMO

## 2021-12-22 DIAGNOSIS — M25562 Pain in left knee: Secondary | ICD-10-CM | POA: Diagnosis present

## 2021-12-22 DIAGNOSIS — M1712 Unilateral primary osteoarthritis, left knee: Secondary | ICD-10-CM | POA: Diagnosis not present

## 2021-12-22 DIAGNOSIS — Z79899 Other long term (current) drug therapy: Secondary | ICD-10-CM | POA: Diagnosis not present

## 2021-12-22 MED ORDER — NAPROXEN 500 MG PO TABS: 500.0000 mg | ORAL_TABLET | Freq: Two times a day (BID) | ORAL | 0 refills | Status: DC

## 2021-12-22 MED ORDER — DICLOFENAC SODIUM 1 % EX GEL: 2.0000 g | Freq: Four times a day (QID) | CUTANEOUS | 0 refills | Status: DC

## 2021-12-22 MED ORDER — IBUPROFEN 400 MG PO TABS
400.0000 mg | ORAL_TABLET | Freq: Once | ORAL | Status: AC | PRN
Start: 2021-12-22 — End: 2021-12-22
  Administered 2021-12-22: 400 mg via ORAL
  Filled 2021-12-22: qty 1

## 2021-12-22 NOTE — Discharge Instructions (Signed)
The medications as needed to help with your symptoms. Your x-ray shows that you have arthritis in your left knee which could most likely be the cause of your pain. Follow-up with your primary care provider or the 1 listed below. Ice and elevating the leg will help as well. Return to the ER if you start to have worsening symptoms, if you develop swelling of your legs, chest pain, shortness of breath or fever

## 2021-12-22 NOTE — ED Triage Notes (Signed)
Pt arrives pov ambulatory c/o left knee pain since this morning. Denies injury, trauma, previous issues with left knee.

## 2021-12-22 NOTE — ED Notes (Signed)
Discharge instructions reviewed with patient. Patient verbalizes understanding, no further questions at this time. Medications/prescriptions and follow up information provided. No acute distress noted at time of departure.  

## 2021-12-22 NOTE — ED Provider Notes (Signed)
[facility] EMERGENCY DEPARTMENT Provider Note   CSN: [id number] Arrival date & time: 12/22/21  1658     History  Chief Complaint  Patient presents with   Knee Pain    Kaitlin Haney is a 56 y.o. female presenting to the ED with a chief complaint of left knee pain.  Symptoms began when she woke up today.  Reports pain is worse with movement.  States that she is concerned that she may have arthritis as her mother has the same.  Denies any prior fracture, dislocation or procedure in the area.  No leg swelling.  Denies any shortness of breath, injury or trauma or recent increased activity.  She states that she works out and walks on a daily basis.   Knee Pain Associated symptoms: no fever       Home Medications Prior to Admission medications   Medication Sig Start Date End Date Taking? Authorizing Provider  diclofenac Sodium (VOLTAREN) 1 % GEL Apply 2 g topically 4 (four) times daily. 12/22/21  Yes Nesa Distel, PA-C  naproxen (NAPROSYN) 500 MG tablet Take 1 tablet (500 mg total) by mouth 2 (two) times daily. 12/22/21  Yes Dondra Rhett, PA-C  amLODipine (NORVASC) 5 MG tablet Take 1 tablet (5 mg total) by mouth daily. 07/07/19   Tilden Fossa, MD      Allergies    Patient has no known allergies.    Review of Systems   Review of Systems  Constitutional:  Negative for chills and fever.  Musculoskeletal:  Positive for arthralgias. Negative for joint swelling.  Neurological:  Negative for weakness and numbness.   Physical Exam Updated Vital Signs BP 131/86 (BP Location: Left Arm)   Pulse 88   Temp 99.2 F (37.3 C) (Oral)   Resp 16   Ht 5\' 9"  (1.753 m)   Wt 129.3 kg   LMP 12/14/2015   SpO2 97%   BMI 42.09 kg/m  Physical Exam Vitals and nursing note reviewed.  Constitutional:      General: She is not in acute distress.    Appearance: She is well-developed. She is not diaphoretic.     Comments: Ambulatory  HENT:     Head: Normocephalic and atraumatic.  Eyes:      General: No scleral icterus.    Conjunctiva/sclera: Conjunctivae normal.  Pulmonary:     Effort: Pulmonary effort is normal. No respiratory distress.  Musculoskeletal:     Cervical back: Normal range of motion.     Comments: No specific tenderness to palpation of the left knee.  Reports some pain with flexion but able to perform range of motion actively and passively.  2+ DP pulse palpated on the left side.  No overlying skin changes of the joint, no erythema, edema or warmth of joint.  No deformities.  Skin:    Findings: No rash.  Neurological:     Mental Status: She is alert.    ED Results / Procedures / Treatments   Labs (all labs ordered are listed, but only abnormal results are displayed) Labs Reviewed - No data to display  EKG None  Radiology DG Knee Complete 4 Views Left  Result Date: 12/22/2021 CLINICAL DATA:  Left knee pain, no known injury. EXAM: LEFT KNEE - COMPLETE 4+ VIEW COMPARISON:  None Available. FINDINGS: No evidence of fracture, dislocation, or joint effusion. 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 Chondrocalcinosis. IMPRESSION: No acute osseous abnormality. 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 Electronically Signed   By: Maudry Mayhew M.D.   On: 12/22/2021 17:46    Procedures Procedures    Medications Ordered in ED Medications  ibuprofen (ADVIL) tablet 400 mg (400 mg Oral Given 12/22/21 1719)    ED Course/ Medical Decision Making/ A&P                           Medical Decision Making Amount and/or Complexity of Data Reviewed Radiology: ordered.  Risk Prescription drug management.   56 year old female presenting to the ED for left knee pain since this morning.  Able to perform range of motion at bedside.  Denies any injury or trauma, leg swelling, history of DVT or PE.  On exam there is no significant edema or calf tenderness noted.  Equal  intact distal pulses noted.  No overlying skin changes of the joint such as erythema, edema or warmth of joint.  X-ray shows findings consistent with tricompartment degenerative changes, possible pseudogout?  Treat with Voltaren and naproxen.  We will have her follow-up with PCP.  I doubt her symptoms are infectious or vascular cause.  We will have her return for any worsening symptoms    Patient is hemodynamically stable, in NAD, and able to ambulate in the ED. Evaluation does not show pathology that would require ongoing emergent intervention or inpatient treatment. I explained the diagnosis to the patient. Pain has been managed and has no complaints prior to discharge. Patient is comfortable with above plan and is stable for discharge at this time. All questions were answered prior to disposition. Strict return precautions for returning to the ED were discussed. Encouraged follow up with PCP.   An After Visit Summary was printed and given to the patient.   Portions of this note were generated with Scientist, clinical (histocompatibility and immunogenetics). Dictation errors may occur despite best attempts at proofreading.        Final Clinical Impression(s) / ED Diagnoses Final diagnoses:  Primary osteoarthritis of left knee    Rx / DC Orders ED Discharge Orders          Ordered    diclofenac Sodium (VOLTAREN) 1 % GEL  4 times daily        12/22/21 1840    naproxen (NAPROSYN) 500 MG tablet  2 times daily        12/22/21 1840              Dietrich Pates, PA-C 12/22/21 1843    Maia Plan, MD 12/22/21 2336

## 2022-08-28 ENCOUNTER — Other Ambulatory Visit: Payer: Self-pay

## 2022-08-28 ENCOUNTER — Emergency Department (HOSPITAL_BASED_OUTPATIENT_CLINIC_OR_DEPARTMENT_OTHER)
Admission: EM | Admit: 2022-08-28 | Discharge: 2022-08-28 | Disposition: A | Discharge status: Home / Self Care | Payer: Commercial Managed Care - HMO | Attending: Emergency Medicine | Admitting: Emergency Medicine

## 2022-08-28 ENCOUNTER — Encounter (HOSPITAL_BASED_OUTPATIENT_CLINIC_OR_DEPARTMENT_OTHER): Payer: Self-pay | Admitting: Emergency Medicine

## 2022-08-28 ENCOUNTER — Emergency Department (HOSPITAL_BASED_OUTPATIENT_CLINIC_OR_DEPARTMENT_OTHER): Payer: Commercial Managed Care - HMO

## 2022-08-28 DIAGNOSIS — Z79899 Other long term (current) drug therapy: Secondary | ICD-10-CM | POA: Diagnosis not present

## 2022-08-28 DIAGNOSIS — M25572 Pain in left ankle and joints of left foot: Secondary | ICD-10-CM | POA: Diagnosis present

## 2022-08-28 DIAGNOSIS — I10 Essential (primary) hypertension: Secondary | ICD-10-CM | POA: Insufficient documentation

## 2022-08-28 MED ORDER — PREDNISONE 20 MG PO TABS
40.0000 mg | ORAL_TABLET | Freq: Once | ORAL | Status: AC
  Administered 2022-08-28: 40 mg via ORAL
  Filled 2022-08-28: qty 2

## 2022-08-28 MED ORDER — PREDNISONE 10 MG PO TABS: 40.0000 mg | ORAL_TABLET | Freq: Every day | ORAL | 0 refills | Status: AC

## 2022-08-28 MED ORDER — IBUPROFEN 400 MG PO TABS
600.0000 mg | ORAL_TABLET | Freq: Once | ORAL | Status: AC
  Administered 2022-08-28: 600 mg via ORAL
  Filled 2022-08-28: qty 1

## 2022-08-28 NOTE — ED Triage Notes (Signed)
Pt w/ LT foot pain and swelling x 3d; no injury

## 2022-08-28 NOTE — ED Provider Notes (Signed)
[facility] Provider Note   CSN: [id number] Arrival date & time: 08/28/22  1517     History  Chief Complaint  Patient presents with   Foot Pain    Kaitlin Haney is a 57 y.o. female.   Foot Pain  Patient is a 57 year old female with a past medical history of hypertension and gout  She states that she has been drinking more beer than usual but states that she has been eating normal diet.        Home Medications Prior to Admission medications   Medication Sig Start Date End Date Taking? Authorizing Provider  predniSONE (DELTASONE) 10 MG tablet Take 4 tablets (40 mg total) by mouth daily for 5 days. 08/28/22 09/02/22 Yes Beatrix Breece S, PA  amLODipine (NORVASC) 5 MG tablet Take 1 tablet (5 mg total) by mouth daily. 07/07/19   Quintella Reichert, MD  diclofenac Sodium (VOLTAREN) 1 % GEL Apply 2 g topically 4 (four) times daily. 12/22/21   Khatri, Hina, PA-C  naproxen (NAPROSYN) 500 MG tablet Take 1 tablet (500 mg total) by mouth 2 (two) times daily. 12/22/21   Delia Heady, PA-C      Allergies    Patient has no known allergies.    Review of Systems   Review of Systems  Physical Exam Updated Vital Signs BP (!) 144/91   Pulse 73   Temp 98.8 F (37.1 C) (Oral)   Resp 18   Ht 5' 9"$  (1.753 m)   Wt (!) 140.6 kg   LMP 12/14/2015   SpO2 99%   BMI 45.78 kg/m  Physical Exam Vitals and nursing note reviewed.  Constitutional:      General: She is not in acute distress.    Appearance: Normal appearance. She is not ill-appearing.     Comments: Pleasant well-appearing 58 year old.  In no acute distress.  Sitting comfortably in bed.  Able answer questions appropriately follow commands. No increased work of breathing. Speaking in full sentences.   HENT:     Head: Normocephalic and atraumatic.  Eyes:     General: No scleral icterus.       Right eye: No discharge.        Left eye: No discharge.     Conjunctiva/sclera: Conjunctivae  normal.  Pulmonary:     Effort: Pulmonary effort is normal.     Breath sounds: No stridor.  Musculoskeletal:     Comments: Able to range his left ankle through nearly full range of movement.  Some discomfort with extreme plantarflexion.  No knee discomfort FROM of knee, toes.   Sensation intact BL lower extremities.   NO redness or swelling notable to ankle or foot  Skin:    General: Skin is warm and dry.     Comments: No evidence of cellulitis  Neurological:     Mental Status: She is alert and oriented to person, place, and time. Mental status is at baseline.  Psychiatric:        Mood and Affect: Mood normal.        Behavior: Behavior normal.     ED Results / Procedures / Treatments   Labs (all labs ordered are listed, but only abnormal results are displayed) Labs Reviewed - No data to display  EKG None  Radiology DG Ankle Complete Left  Result Date: 08/28/2022 CLINICAL DATA:  Left ankle pain and swelling for several days. No known injury. EXAM: LEFT ANKLE COMPLETE - 3+ VIEW COMPARISON:  None Available.  FINDINGS: No fracture.  No bone lesion. Ankle joint normally spaced and aligned.  No arthropathic changes. Moderate dorsal and small plantar calcaneal spurs. Mild diffuse soft tissue edema. IMPRESSION: 1. No fracture, bone lesion or ankle joint abnormality. 2. Heel spurs. Electronically Signed   By: Lajean Manes M.D.   On: 08/28/2022 15:54    Procedures Procedures    Medications Ordered in ED Medications  ibuprofen (ADVIL) tablet 600 mg (600 mg Oral Given 08/28/22 1608)  predniSONE (DELTASONE) tablet 40 mg (40 mg Oral Given 08/28/22 1608)    ED Course/ Medical Decision Making/ A&P                             Medical Decision Making Amount and/or Complexity of Data Reviewed Radiology: ordered.  Risk Prescription drug management.   Patient is 57 year old female with a history of gouty arthritis.  She states she feels that she has a gout flare in her right ankle.   She has full range of motion of her ankle she does not have any tenderness she states at night it gets worse and even if she touching it can be quite painful.  No trauma.  X-ray personally viewed by me and I did not appreciate any fractures she does have some heel spurs.  Differential includes cutting arthritis, heel spur related pain, inflammatory pain, CPPD.  Doubt septic arthritis given full range of motion and lack of fever or systemic symptoms warmth or redness.  IMPRESSION:  1. No fracture, bone lesion or ankle joint abnormality.  2. Heel spurs.      Electronically Signed    By: Lajean Manes M.D.    On: 08/28/2022 15:54    Will DC home w strict return precautions.    Final Clinical Impression(s) / ED Diagnoses Final diagnoses:  Acute left ankle pain    Rx / DC Orders ED Discharge Orders          Ordered    predniSONE (DELTASONE) 10 MG tablet  Daily        08/28/22 1612              Tedd Sias, [place] 08/28/22 2238    Drenda Freeze, MD 08/28/22 2326

## 2022-08-28 NOTE — Discharge Instructions (Addendum)
You have some bone spurs on your heel which very well may be the source of your pain. It is also very possible this is a gout flare.   I have written you a prescription for prednisone, follow up with your primary care doctor.   Please use Tylenol or ibuprofen for pain.  You may use 600 mg ibuprofen every 6 hours or 1000 mg of Tylenol every 6 hours.  You may choose to alternate between the 2.  This would be most effective.  Not to exceed 4 g of Tylenol within 24 hours.  Not to exceed 3200 mg ibuprofen 24 hours.    Please follow the diet that I have printed for you

## 2022-10-07 ENCOUNTER — Other Ambulatory Visit (HOSPITAL_BASED_OUTPATIENT_CLINIC_OR_DEPARTMENT_OTHER): Payer: Self-pay | Admitting: Nurse Practitioner

## 2022-10-07 DIAGNOSIS — Z1231 Encounter for screening mammogram for malignant neoplasm of breast: Secondary | ICD-10-CM

## 2022-10-25 ENCOUNTER — Ambulatory Visit (HOSPITAL_BASED_OUTPATIENT_CLINIC_OR_DEPARTMENT_OTHER): Payer: Commercial Managed Care - HMO

## 2022-10-31 IMAGING — DX DG KNEE COMPLETE 4+V*L*
4 series · 4 of 4 positions shown · non-contrast
Comparison: None Available.

CLINICAL DATA: Left knee pain, no known injury.

EXAM:
LEFT KNEE - COMPLETE 4+ VIEW

[knee ap]
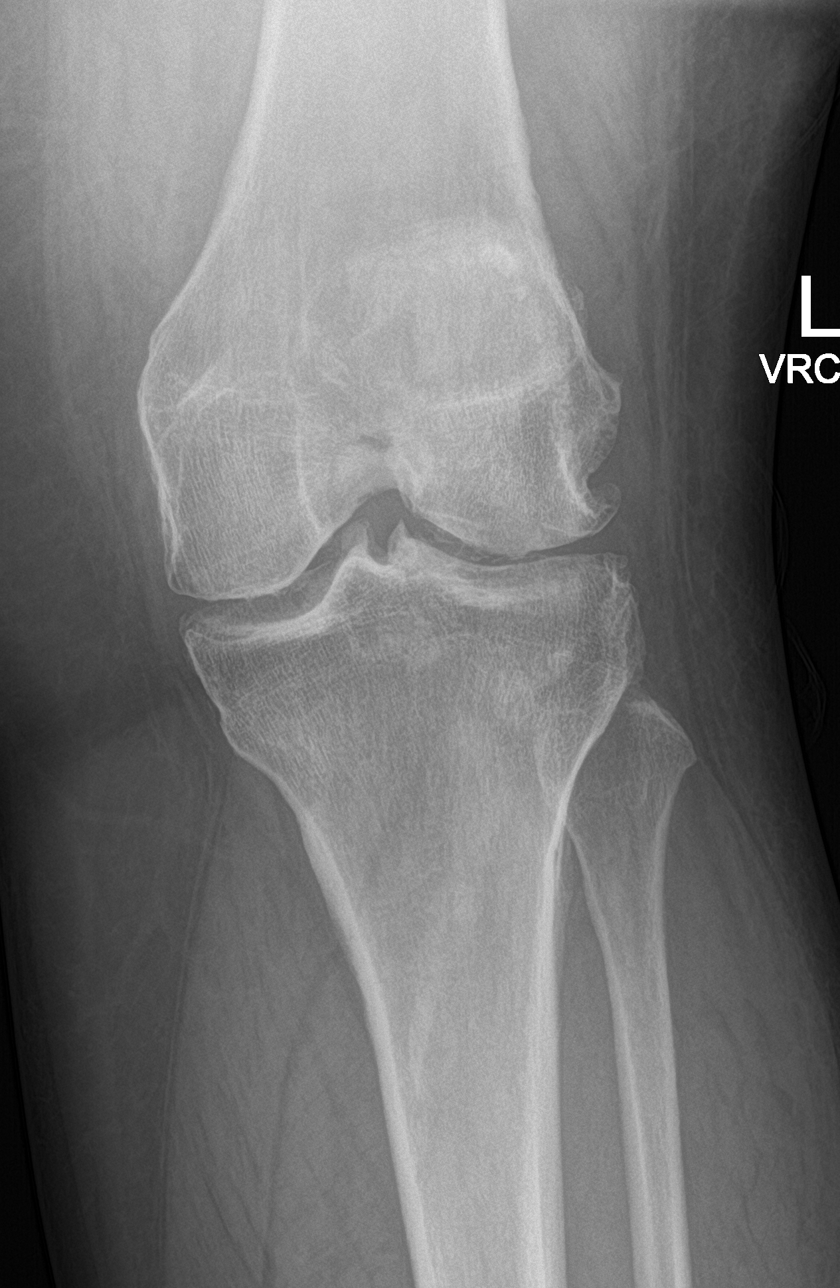

[knee obl (1 of 2)]
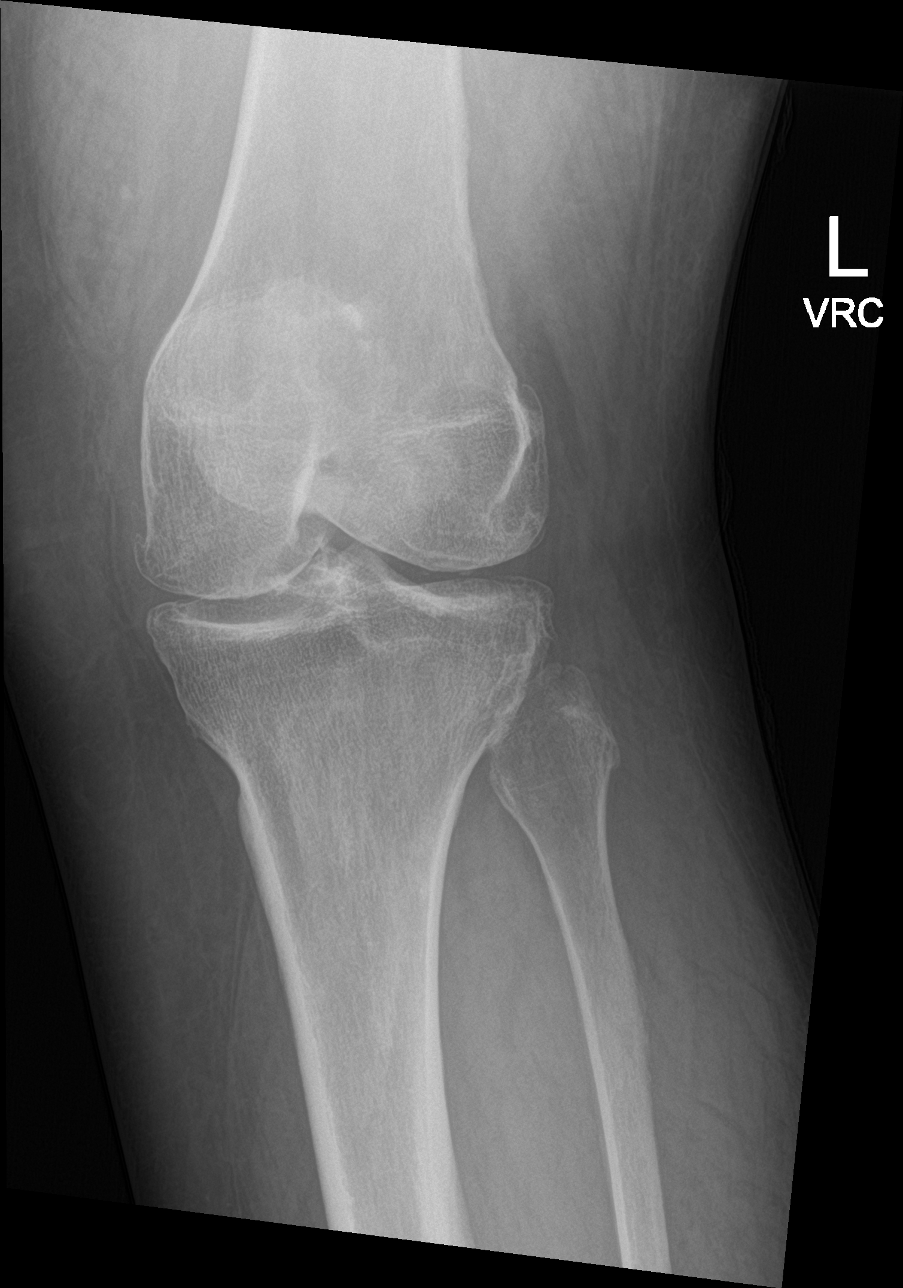

[knee obl (2 of 2)]
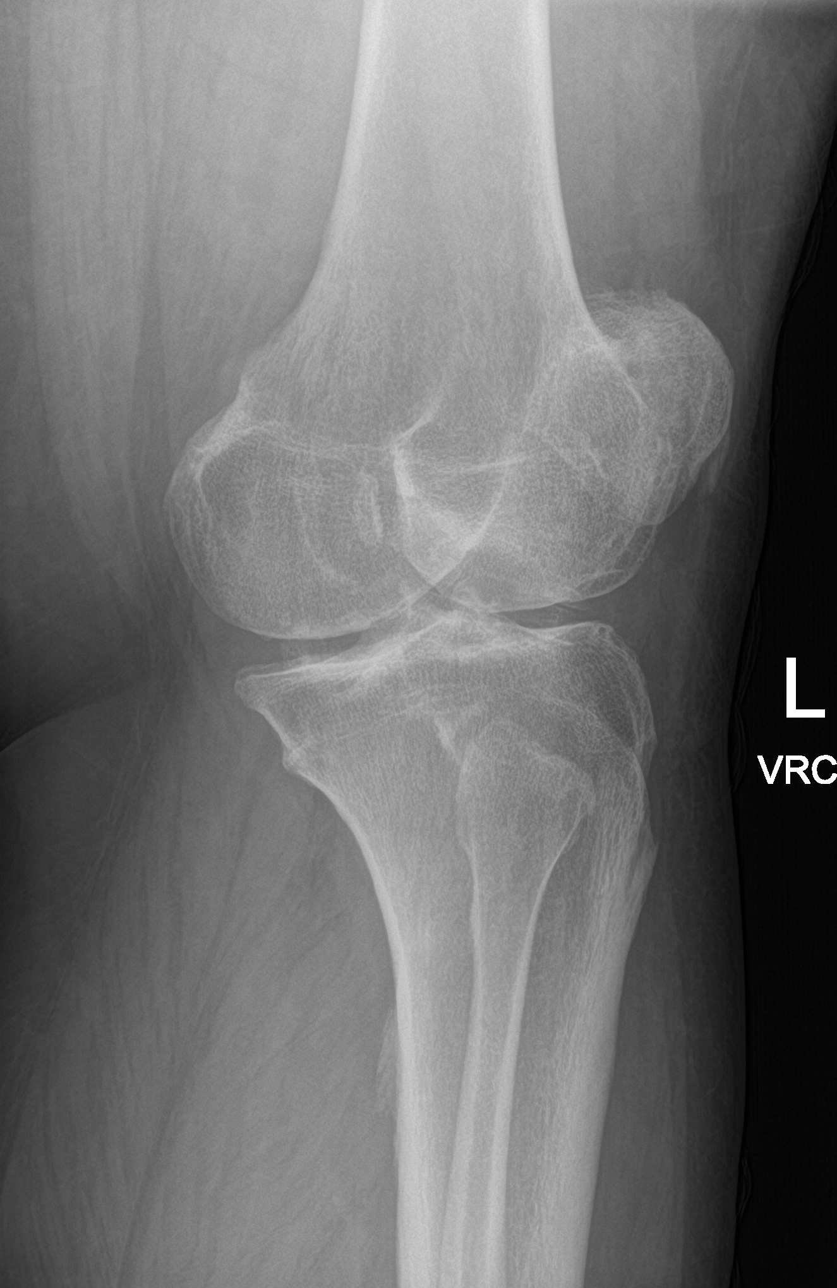

[knee lat]
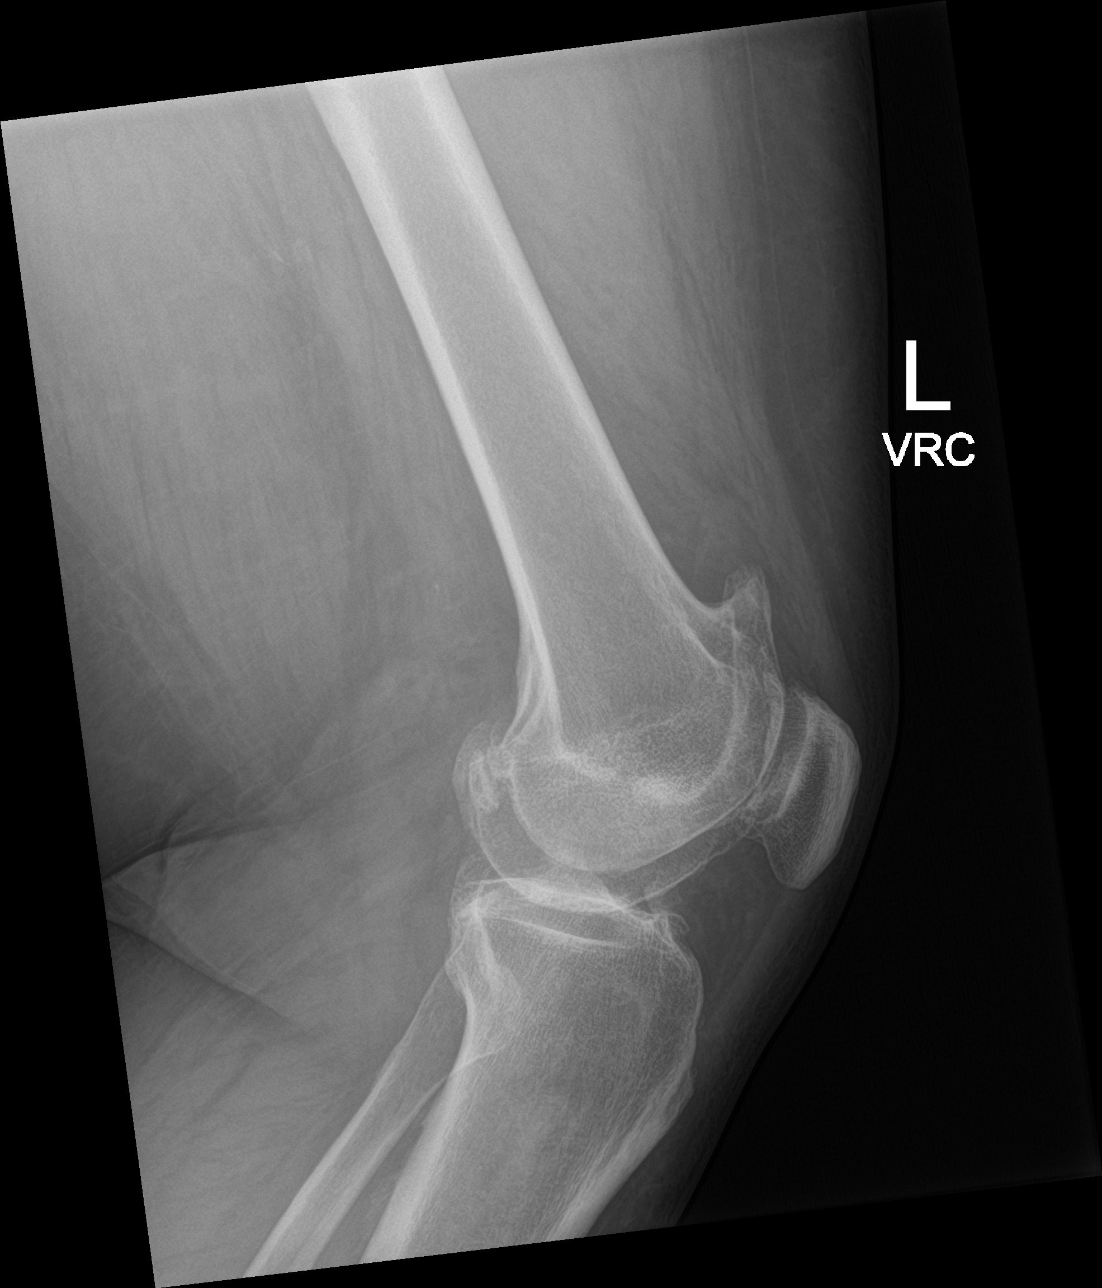

[4 of 4 positions shown; findings below may reference images not displayed]

FINDINGS: No evidence of fracture, dislocation, or joint effusion.
Tricompartment DJD most notably in the patellofemoral compartment.
Chondrocalcinosis.
IMPRESSION: No acute osseous abnormality.

Tricompartment DJD most notably in the patellofemoral compartment
with chondrocalcinosis suggestive of CPPD arthropathy.

## 2022-11-02 ENCOUNTER — Ambulatory Visit
Admission: RE | Admit: 2022-11-02 | Discharge: 2022-11-02 | Disposition: A | Discharge status: Home / Self Care | Payer: Commercial Managed Care - HMO | Source: Ambulatory Visit | Attending: Nurse Practitioner | Admitting: Nurse Practitioner

## 2022-11-02 DIAGNOSIS — Z1231 Encounter for screening mammogram for malignant neoplasm of breast: Secondary | ICD-10-CM

## 2023-01-08 ENCOUNTER — Emergency Department (HOSPITAL_BASED_OUTPATIENT_CLINIC_OR_DEPARTMENT_OTHER): Payer: Commercial Managed Care - HMO

## 2023-01-08 ENCOUNTER — Emergency Department (HOSPITAL_BASED_OUTPATIENT_CLINIC_OR_DEPARTMENT_OTHER)
Admission: EM | Admit: 2023-01-08 | Discharge: 2023-01-08 | Disposition: A | Discharge status: Home / Self Care | Payer: Commercial Managed Care - HMO | Attending: Emergency Medicine | Admitting: Emergency Medicine

## 2023-01-08 ENCOUNTER — Encounter (HOSPITAL_BASED_OUTPATIENT_CLINIC_OR_DEPARTMENT_OTHER): Payer: Self-pay | Admitting: Emergency Medicine

## 2023-01-08 ENCOUNTER — Other Ambulatory Visit: Payer: Self-pay

## 2023-01-08 DIAGNOSIS — M79672 Pain in left foot: Secondary | ICD-10-CM

## 2023-01-08 DIAGNOSIS — Z79899 Other long term (current) drug therapy: Secondary | ICD-10-CM | POA: Insufficient documentation

## 2023-01-08 DIAGNOSIS — I10 Essential (primary) hypertension: Secondary | ICD-10-CM | POA: Insufficient documentation

## 2023-01-08 MED ORDER — PREDNISONE 10 MG PO TABS
20.0000 mg | ORAL_TABLET | Freq: Every day | ORAL | 0 refills | Status: AC
Start: 2023-01-08 — End: 2023-01-18

## 2023-01-08 MED ORDER — KETOROLAC TROMETHAMINE 15 MG/ML IJ SOLN
15.0000 mg | Freq: Once | INTRAMUSCULAR | Status: AC
  Administered 2023-01-08: 15 mg via INTRAMUSCULAR
  Filled 2023-01-08: qty 1

## 2023-01-08 MED ORDER — INDOMETHACIN 50 MG PO CAPS: 50.0000 mg | ORAL_CAPSULE | Freq: Two times a day (BID) | ORAL | 0 refills | Status: AC

## 2023-01-08 MED ORDER — DEXAMETHASONE SODIUM PHOSPHATE 10 MG/ML IJ SOLN
10.0000 mg | Freq: Once | INTRAMUSCULAR | Status: AC
  Administered 2023-01-08: 10 mg via INTRAMUSCULAR
  Filled 2023-01-08: qty 1

## 2023-01-08 NOTE — ED Provider Notes (Signed)
[place] [facility] AT [facility] Provider Note   CSN: [id number] Arrival date & time: 01/08/23  7829     History  Chief Complaint  Patient presents with   Gout    Kaitlin Haney is a 57 y.o. female.  Patient presents to the emergency department complaining of left-sided foot pain that began last night.  Patient believes it is a gout flare as she has had many of these in the past.  She states this feels similar to her previous gout flares.  She currently takes no medication to prevent gout flares.  She denies any falls or trauma.  Past medical history significant for hypertension  HPI     Home Medications Prior to Admission medications   Medication Sig Start Date End Date Taking? Authorizing Provider  amLODipine (NORVASC) 5 MG tablet Take 1 tablet (5 mg total) by mouth daily. 07/07/19   Tilden Fossa, MD  diclofenac Sodium (VOLTAREN) 1 % GEL Apply 2 g topically 4 (four) times daily. 12/22/21   Khatri, Hina, PA-C  naproxen (NAPROSYN) 500 MG tablet Take 1 tablet (500 mg total) by mouth 2 (two) times daily. 12/22/21   Dietrich Pates, PA-C      Allergies    Patient has no known allergies.    Review of Systems   Review of Systems  Physical Exam Updated Vital Signs BP 130/82 (BP Location: Left Arm)   Pulse 70   Temp 97.6 F (36.4 C) (Oral)   Resp 19   LMP 12/14/2015   SpO2 98%  Physical Exam Vitals and nursing note reviewed.  Constitutional:      General: She is not in acute distress. HENT:     Head: Normocephalic.  Eyes:     Conjunctiva/sclera: Conjunctivae normal.  Cardiovascular:     Rate and Rhythm: Normal rate.  Pulmonary:     Effort: Pulmonary effort is normal.  Musculoskeletal:     Comments: Able to range her left ankle through full range of movement.    Normal ROM toes.    Sensation intact BL lower extremities.    NO redness or swelling notable to ankle or foot    Neurological:     Mental Status: She is alert.     ED Results  / Procedures / Treatments   Labs (all labs ordered are listed, but only abnormal results are displayed) Labs Reviewed - No data to display  EKG None  Radiology No results found.  Procedures Procedures    Medications Ordered in ED Medications  dexamethasone (DECADRON) injection 10 mg (has no administration in time range)  ketorolac (TORADOL) 15 MG/ML injection 15 mg (has no administration in time range)    ED Course/ Medical Decision Making/ A&P                             Medical Decision Making Amount and/or Complexity of Data Reviewed Radiology: ordered.  Risk Prescription drug management.   Patient with history of gouty arthritis.  Chief complaint of foot pain.  Differential includes gout flare, fracture, dislocation, others  I ordered and interpreted imaging including plain films of the left foot.  I did not see any fractures or dislocations.  I agree with the radiologist's findings.  Doubt septic arthritis as patient is able to move through full range of motion.  Patient denies any systemic symptoms or fever at this time.  I ordered the patient medication including Toradol Decadron.  Upon  reassessment she was feeling somewhat better.  Plan to discharge home on prescription of prednisone and indomethacin with plans for outpatient follow-up for further testing as needed.  Patient voices understanding with plan.        Final Clinical Impression(s) / ED Diagnoses Final diagnoses:  None    Rx / DC Orders ED Discharge Orders     None         Pamala Duffel 01/08/23 1140    Tanda Rockers A, DO 01/10/23 0024

## 2023-01-08 NOTE — Discharge Instructions (Addendum)
You were evaluated today for left foot pain.  Your symptoms are consistent with possible gout flare.  I have prescribed a steroid to be taken daily.  I also prescribed an antiinflammatory called indomethacin.  Do not take naprosyn or other NSAIDs while taking the indomethacin. Please follow-up with your primary care provider for further evaluation and management.  If they determined this is related to gout they may be able to prescribe medication to help prevent flares.

## 2023-01-08 NOTE — ED Triage Notes (Signed)
C/o "gout flare" to L foot. Pain since last night.

## 2023-04-05 ENCOUNTER — Emergency Department (HOSPITAL_BASED_OUTPATIENT_CLINIC_OR_DEPARTMENT_OTHER)
Admission: EM | Admit: 2023-04-05 | Discharge: 2023-04-05 | Disposition: A | Discharge status: Home / Self Care | Payer: Commercial Managed Care - HMO | Attending: Emergency Medicine | Admitting: Emergency Medicine

## 2023-04-05 ENCOUNTER — Encounter (HOSPITAL_BASED_OUTPATIENT_CLINIC_OR_DEPARTMENT_OTHER): Payer: Self-pay

## 2023-04-05 ENCOUNTER — Other Ambulatory Visit: Payer: Self-pay

## 2023-04-05 DIAGNOSIS — I10 Essential (primary) hypertension: Secondary | ICD-10-CM | POA: Insufficient documentation

## 2023-04-05 DIAGNOSIS — Z79899 Other long term (current) drug therapy: Secondary | ICD-10-CM | POA: Diagnosis not present

## 2023-04-05 DIAGNOSIS — M79675 Pain in left toe(s): Secondary | ICD-10-CM | POA: Diagnosis present

## 2023-04-05 HISTORY — DX: Gout, unspecified: M10.9

## 2023-04-05 MED ORDER — INDOMETHACIN 50 MG PO CAPS: 50.0000 mg | ORAL_CAPSULE | Freq: Two times a day (BID) | ORAL | 0 refills | Status: AC

## 2023-04-05 MED ORDER — DEXAMETHASONE SODIUM PHOSPHATE 10 MG/ML IJ SOLN
10.0000 mg | Freq: Once | INTRAMUSCULAR | Status: AC
  Administered 2023-04-05: 10 mg via INTRAMUSCULAR
  Filled 2023-04-05: qty 1

## 2023-04-05 NOTE — ED Provider Notes (Signed)
[place] [facility] Provider Note   CSN: [id number] Arrival date & time: 04/05/23  1004     History  Chief Complaint  Patient presents with   Toe Pain    Kaitlin Haney is a 57 y.o. female.  57 year old female presents with left great toe pain. History of gout, onset this morning at 4am, no injury, feels same as prior gout attacks. [place]CP [place] manages [place]. History of HTN.        Home Medications Prior to Admission medications   Medication Sig Start Date End Date Taking? Authorizing Provider  indomethacin (INDOCIN) 50 MG capsule Take 1 capsule (50 mg total) by mouth 2 (two) times daily with a meal for 7 days. 04/05/23 04/12/23 Yes Jeannie Fend, PA-C  amLODipine (NORVASC) 5 MG tablet Take 1 tablet (5 mg total) by mouth daily. 07/07/19   Tilden Fossa, MD      Allergies    Patient has no known allergies.    Review of Systems   Review of Systems Negative except as per HPI Physical Exam Updated Vital Signs BP (!) 144/89   Pulse (!) 55   Temp 98.6 F (37 C)   Resp 18   Wt 136.1 kg   LMP 12/14/2015   SpO2 99%   BMI 44.30 kg/m  Physical Exam Vitals and nursing note reviewed.  Constitutional:      General: She is not in acute distress.    Appearance: She is well-developed. She is not diaphoretic.  HENT:     Head: Normocephalic and atraumatic.  Cardiovascular:     Pulses: Normal pulses.  Pulmonary:     Effort: Pulmonary effort is normal.  Musculoskeletal:        General: Tenderness present.     Comments: Tenderness noted at left first MTP without erythema or swelling.  Range of motion normal.  Skin intact.  DP pulses present.  Skin:    General: Skin is warm and dry.  Neurological:     Mental Status: She is alert and oriented to person, place, and time.  Psychiatric:        Behavior: Behavior normal.     ED Results / Procedures / Treatments   Labs (all labs ordered are listed, but only  abnormal results are displayed) Labs Reviewed - No data to display  EKG None  Radiology No results found.  Procedures Procedures    Medications Ordered in ED Medications  dexamethasone (DECADRON) injection 10 mg (10 mg Intramuscular Given 04/05/23 1131)    ED Course/ Medical Decision Making/ A&P                                 Medical Decision Making Risk Prescription drug management.   This patient presents to the ED for concern of toe pain, this involves an extensive number of treatment options, and is a complaint that carries with it a high risk of complications and morbidity.  The differential diagnosis includes gout, arthritis, septic arthritis, fracture, tendinitis   Co morbidities that complicate the patient evaluation  Hypertension, no history of diabetes   Additional history obtained:  External records from outside source obtained and reviewed including prior CMP on file with Cr 0.83 dated 12/01/22 Prior XR dated 01/08/23 with no acute bony abnormality, mild dorsal soft tissue edema, heel spurs.   Problem List / ED Course / Critical interventions /  Medication management  57 year old female presents with complaint of pain in her left first MTP, history of gout, states this feels similar to prior exacerbations.  Notes that she was eating cheese recently, unsure if related.  Also prescribed HCTZ based medication.  Exam found to have tenderness at left first MTP, skin is intact, no overlying erythema, normal range of motion, doubt septic arthritis.  No reports of injury to suspect trauma/fracture.  Patient reports severe pain, will cover with IM Decadron today as she received this previously with improvement.  Also prescribed indomethacin, prior creatinine on file checked.  Plan is to follow-up with PCP, discuss her HCTZ medication, discuss potential for allopurinol however discontinued medication may improve her attack frequency.  Also discussed potential dietary  causes. I ordered medication including Decadron for joint pain I have reviewed the patients home medicines and have made adjustments as needed   Social Determinants of Health:  Has PCP   Test / Admission - Considered:  Stable for dc         Final Clinical Impression(s) / ED Diagnoses Final diagnoses:  Pain of left great toe    Rx / DC Orders ED Discharge Orders          Ordered    indomethacin (INDOCIN) 50 MG capsule  2 times daily with meals        04/05/23 1126              Jeannie Fend, PA-C 04/05/23 1200    Vanetta Mulders, MD 04/07/23 1235

## 2023-04-05 NOTE — Discharge Instructions (Addendum)
Discuss prevention with Allopurinol with your PCP in follow up. Consider change from your hydrochlorothiazide medication which can exacerbate gout.   Take Indomethacin as prescribed for pain.

## 2023-04-05 NOTE — ED Triage Notes (Signed)
Pt reports gout began flaring yesterday  Pain increased 4 am. Pain left great toe and foot

## 2023-04-05 NOTE — ED Notes (Signed)
D/c paperwork reviewed with pt, including prescriptions and follow up care.  No questions or concerns voiced at time of d/c. Marland Kitchen Pt verbalized understanding, Wheeled by ED staff to ED exit, NAD.

## 2023-04-12 ENCOUNTER — Other Ambulatory Visit (HOSPITAL_BASED_OUTPATIENT_CLINIC_OR_DEPARTMENT_OTHER): Payer: Self-pay | Admitting: Nurse Practitioner

## 2023-04-12 ENCOUNTER — Ambulatory Visit (HOSPITAL_BASED_OUTPATIENT_CLINIC_OR_DEPARTMENT_OTHER)
Admission: RE | Admit: 2023-04-12 | Discharge: 2023-04-12 | Disposition: A | Discharge status: Home / Self Care | Payer: Commercial Managed Care - HMO | Source: Ambulatory Visit | Attending: Nurse Practitioner | Admitting: Nurse Practitioner

## 2023-04-12 DIAGNOSIS — M25551 Pain in right hip: Secondary | ICD-10-CM | POA: Diagnosis present

## 2023-04-26 ENCOUNTER — Emergency Department (HOSPITAL_BASED_OUTPATIENT_CLINIC_OR_DEPARTMENT_OTHER)
Admission: EM | Admit: 2023-04-26 | Discharge: 2023-04-26 | Disposition: A | Discharge status: Home / Self Care | Payer: Commercial Managed Care - HMO | Attending: Emergency Medicine | Admitting: Emergency Medicine

## 2023-04-26 ENCOUNTER — Emergency Department (HOSPITAL_BASED_OUTPATIENT_CLINIC_OR_DEPARTMENT_OTHER): Payer: Commercial Managed Care - HMO

## 2023-04-26 ENCOUNTER — Other Ambulatory Visit: Payer: Self-pay

## 2023-04-26 ENCOUNTER — Encounter (HOSPITAL_BASED_OUTPATIENT_CLINIC_OR_DEPARTMENT_OTHER): Payer: Self-pay | Admitting: Pediatrics

## 2023-04-26 DIAGNOSIS — G8929 Other chronic pain: Secondary | ICD-10-CM | POA: Insufficient documentation

## 2023-04-26 DIAGNOSIS — M545 Low back pain, unspecified: Secondary | ICD-10-CM | POA: Diagnosis present

## 2023-04-26 DIAGNOSIS — M5441 Lumbago with sciatica, right side: Secondary | ICD-10-CM | POA: Diagnosis not present

## 2023-04-26 LAB — URINALYSIS, ROUTINE W REFLEX MICROSCOPIC
Bilirubin Urine: NEGATIVE
Glucose, UA: NEGATIVE mg/dL
Hgb urine dipstick: NEGATIVE
Ketones, ur: NEGATIVE mg/dL
Nitrite: NEGATIVE
Protein, ur: NEGATIVE mg/dL
Specific Gravity, Urine: 1.025 (ref 1.005–1.030)
pH: 5 (ref 5.0–8.0)

## 2023-04-26 LAB — URINALYSIS, MICROSCOPIC (REFLEX)

## 2023-04-26 MED ORDER — KETOROLAC TROMETHAMINE 60 MG/2ML IM SOLN
60.0000 mg | Freq: Once | INTRAMUSCULAR | Status: AC
  Administered 2023-04-26: 60 mg via INTRAMUSCULAR
  Filled 2023-04-26: qty 2

## 2023-04-26 MED ORDER — LIDOCAINE 5 % EX PTCH: 1.0000 | MEDICATED_PATCH | CUTANEOUS | 0 refills | Status: DC

## 2023-04-26 MED ORDER — HYDROCODONE-ACETAMINOPHEN 5-325 MG PO TABS
1.0000 | ORAL_TABLET | Freq: Once | ORAL | Status: AC
  Administered 2023-04-26: 1 via ORAL
  Filled 2023-04-26: qty 1

## 2023-04-26 MED ORDER — CYCLOBENZAPRINE HCL 5 MG PO TABS
5.0000 mg | ORAL_TABLET | Freq: Once | ORAL | Status: AC
  Administered 2023-04-26: 5 mg via ORAL
  Filled 2023-04-26: qty 1

## 2023-04-26 MED ORDER — KETOROLAC TROMETHAMINE 10 MG PO TABS: 10.0000 mg | ORAL_TABLET | Freq: Four times a day (QID) | ORAL | 0 refills | Status: DC | PRN

## 2023-04-26 MED ORDER — LIDOCAINE 5 % EX PTCH
1.0000 | MEDICATED_PATCH | CUTANEOUS | Status: DC
  Administered 2023-04-26: 1 via TRANSDERMAL
  Filled 2023-04-26: qty 1

## 2023-04-26 MED ORDER — CYCLOBENZAPRINE HCL 10 MG PO TABS: 10.0000 mg | ORAL_TABLET | Freq: Two times a day (BID) | ORAL | 0 refills | Status: DC | PRN

## 2023-04-26 NOTE — ED Triage Notes (Signed)
C/O R hip and low back pain, worsen since Sunday, denies recent injury. States took some OTC and some muscle relaxant that helps.

## 2023-04-26 NOTE — ED Provider Notes (Addendum)
[place] [facility] Provider Note   CSN: [id number] Arrival date & time: 04/26/23  1304     History  Chief Complaint  Patient presents with   Hip Pain   Back Pain    Kaitlin Haney is a 57 y.o. female.   Hip Pain  Back Pain    57 year old female presenting to the emergency department with low back pain.  The patient states that she has had back pain for roughly the last 4 weeks.  She denies any falls or trauma.  She states that she has had pain in her bilateral low back that radiates down to her right hip and down her right leg.  She states that the leg occasionally "gives out on me."  She denies any true weakness in the bilateral lower extremities, denies any numbness in the lower extremities.  She denies any saddle anesthesia.  She denies any fevers, chills, urinary or fecal incontinence.  She denies any genitourinary symptoms.  He states that the pain radiates from her back down to her right hip and occasionally down her leg and is described as a sharp pain and occasionally a burning pain.  She has not followed up outpatient regarding this.  Home Medications Prior to Admission medications   Medication Sig Start Date End Date Taking? Authorizing Provider  cyclobenzaprine (FLEXERIL) 10 MG tablet Take 1 tablet (10 mg total) by mouth 2 (two) times daily as needed for muscle spasms. 04/26/23  Yes Ernie Avena, MD  ketorolac (TORADOL) 10 MG tablet Take 1 tablet (10 mg total) by mouth every 6 (six) hours as needed. 04/26/23  Yes Ernie Avena, MD  lidocaine (LIDODERM) 5 % Place 1 patch onto the skin daily. Remove & Discard patch within 12 hours or as directed by MD 04/26/23  Yes Ernie Avena, MD  amLODipine (NORVASC) 5 MG tablet Take 1 tablet (5 mg total) by mouth daily. 07/07/19   Tilden Fossa, MD      Allergies    Patient has no known allergies.    Review of Systems   Review of Systems  Musculoskeletal:  Positive for back pain.  All  other systems reviewed and are negative.   Physical Exam Updated Vital Signs BP (!) 124/112 (BP Location: Left Arm)   Pulse (!) 57   Temp 98.3 F (36.8 C)   Resp 17   Ht 5\' 9"  (1.753 m)   Wt 136.1 kg   LMP 12/14/2015   SpO2 100%   BMI 44.30 kg/m  Physical Exam Vitals and nursing note reviewed.  Constitutional:      General: She is not in acute distress.    Appearance: She is well-developed.  HENT:     Head: Normocephalic and atraumatic.  Eyes:     Conjunctiva/sclera: Conjunctivae normal.  Cardiovascular:     Rate and Rhythm: Normal rate and regular rhythm.     Heart sounds: No murmur heard. Pulmonary:     Effort: Pulmonary effort is normal. No respiratory distress.     Breath sounds: Normal breath sounds.  Abdominal:     Palpations: Abdomen is soft.     Tenderness: There is no abdominal tenderness.  Musculoskeletal:        General: No swelling.     Cervical back: Neck supple.     Comments: Positive straight leg raise test on the right, no midline spinal tenderness of the cervical, thoracic or lumbar spine, mild paraspinal muscular tenderness bilaterally  Skin:    General:  Skin is warm and dry.     Capillary Refill: Capillary refill takes less than 2 seconds.  Neurological:     Mental Status: She is alert.     Comments: 5 out of 5 strength in the bilateral upper and lower extremities, 2+ DTRs in the prepatellar reflexes, intact sensation to light touch all 4 extremities  Psychiatric:        Mood and Affect: Mood normal.     ED Results / Procedures / Treatments   Labs (all labs ordered are listed, but only abnormal results are displayed) Labs Reviewed  URINALYSIS, ROUTINE W REFLEX MICROSCOPIC - Abnormal; Notable for the following components:      Result Value   APPearance CLOUDY (*)    Leukocytes,Ua SMALL (*)    All other components within normal limits  URINALYSIS, MICROSCOPIC (REFLEX) - Abnormal; Notable for the following components:   Bacteria, UA FEW (*)     Trichomonas, UA PRESENT (*)    All other components within normal limits    EKG None  Radiology DG Hip Unilat  With Pelvis 2-3 Views Right  Result Date: 04/26/2023 CLINICAL DATA:  Hip pain for 2 days no provided history of trauma EXAM: DG HIP (WITH OR WITHOUT PELVIS) 3V RIGHT COMPARISON:  None Available. FINDINGS: No fracture or dislocation. Preserved joint spaces and bone mineralization. Hyperostosis. Small osteophytes suggested. IMPRESSION: Slight degenerative change.  No acute osseous abnormality Electronically Signed   By: Karen Kays M.D.   On: 04/26/2023 15:09    Procedures Procedures    Medications Ordered in ED Medications  lidocaine (LIDODERM) 5 % 1 patch (1 patch Transdermal Patch Applied 04/26/23 1449)  HYDROcodone-acetaminophen (NORCO/VICODIN) 5-325 MG per tablet 1 tablet (1 tablet Oral Given 04/26/23 1448)  ketorolac (TORADOL) injection 60 mg (60 mg Intramuscular Given 04/26/23 1450)  cyclobenzaprine (FLEXERIL) tablet 5 mg (5 mg Oral Given 04/26/23 1448)    ED Course/ Medical Decision Making/ A&P                                 Medical Decision Making Amount and/or Complexity of Data Reviewed Labs: ordered. Radiology: ordered.  Risk Prescription drug management.    57 year old female presenting to the emergency department with low back pain.  The patient states that she has had back pain for roughly the last 4 weeks.  She denies any falls or trauma.  She states that she has had pain in her bilateral low back that radiates down to her right hip and down her right leg.  She states that the leg occasionally "gives out on me."  She denies any true weakness in the bilateral lower extremities, denies any numbness in the lower extremities.  She denies any saddle anesthesia.  She denies any fevers, chills, urinary or fecal incontinence.  She denies any genitourinary symptoms.  He states that the pain radiates from her back down to her right hip and occasionally down her leg  and is described as a sharp pain and occasionally a burning pain.  She has not followed up outpatient regarding this.  Medical Decision Making:   Shadell Brenn is a 57 y.o. female who presented to the ED today with acute lower back pain over the past 4 weeks, detailed above.      On my initial exam, the pt was with an intact neurologic exam, tolerating ambulation with an antalgic gait and p.o. intake without difficulty.  Patient had  no abnormal DTRs, no midline spinal tenderness.  Patient endorsing complete sensation of the perineum.  Patient without episodes of fecal or urinary incontinence.  Patient has no focal neurologic deficits and reassuring vital signs at this time.  No obvious physical abnormality or injury on exam. Notably, patient denies recent trauma, is afebrile, and denies IVDU.  Reviewed and confirmed nursing documentation for past medical history, family history, social history.    Initial Assessment:   With the patient's presentation of acute back pain in the above setting, most likely diagnosis is musculoskeletal strain. Other diagnoses were considered including (but not limited to) underlying fracture, epidural hematoma, cauda equina syndrome, spinal stenosis, spinal malignancy. These are considered less likely due to history of present illness and physical exam findings.   In particular, lack of fever, substantial history of IV drug use, or substantial neurologic abnormality is less consistent with epidural abscess versus discitis or other spinal infection.   Initial Plan:  Multimodal pain control described and patient informed on safe usage.  Screening evaluation including below radiographic evaluation reviewed and grossly unremarkable at this time XR pelvis and hips ordered in triage based on complaint of hip pain Patient stable for continued outpatient evaluation and management of their musculoskeletal pains.  Patient referred back to primary care provider for continued  evaluation and management.   Initial Study Results:   Radiology  DG Hip Unilat  With Pelvis 2-3 Views Right  Final Result      IMPRESSION:  Slight degenerative change.  No acute osseous abnormality      Disposition:   Based on the above findings, I believe patient is stable for discharge.  Pt without urinary sx, will not treat asymptomatic bacteruria.   Patient and family educated about specific return precautions for given chief complaint and symptoms.  Patient and family educated about follow-up with PCP.  Patient and family expressed understanding of return precautions and need for follow-up. Patient spoken to regarding all imaging and laboratory results and appropriate follow up for these results. All education provided in verbal and written form and time was allowed for answering of patient questions. Patient discharged.        Emergency Department Medication Summary:   Medications  lidocaine (LIDODERM) 5 % 1 patch (1 patch Transdermal Patch Applied 04/26/23 1449)  HYDROcodone-acetaminophen (NORCO/VICODIN) 5-325 MG per tablet 1 tablet (1 tablet Oral Given 04/26/23 1448)  ketorolac (TORADOL) injection 60 mg (60 mg Intramuscular Given 04/26/23 1450)  cyclobenzaprine (FLEXERIL) tablet 5 mg (5 mg Oral Given 04/26/23 1448)      Final Clinical Impression(s) / ED Diagnoses Final diagnoses:  Chronic bilateral low back pain with right-sided sciatica    Rx / DC Orders ED Discharge Orders          Ordered    cyclobenzaprine (FLEXERIL) 10 MG tablet  2 times daily PRN        04/26/23 1512    ketorolac (TORADOL) 10 MG tablet  Every 6 hours PRN        04/26/23 1512    lidocaine (LIDODERM) 5 %  Every 24 hours        04/26/23 1512              Ernie Avena, MD 04/26/23 1516    Ernie Avena, MD 04/26/23 1516

## 2023-04-26 NOTE — Discharge Instructions (Addendum)
Your symptoms are consistent with low back pain with sciatica which can be due to degenerative disc disease or spinal stenosis, essentially causing a pinched nerve in your back. We will treat your pain (dont drive or operate heavy machinery while taking flexeril). Symptoms warranting an emergent MRI would be back pain and fever, new falls or trauma and back pain, back pain with urinary or fecal incontinence, back pain with bilateral lower extremity weakness, numbness in the area in your groin where you would sit on the side.  Or worsening weakness along one side of your legs. You could benefit from an outpatient MRI. Follow-up with your PCP regarding your pain.

## 2023-05-18 ENCOUNTER — Encounter (HOSPITAL_BASED_OUTPATIENT_CLINIC_OR_DEPARTMENT_OTHER): Payer: Self-pay

## 2023-05-18 ENCOUNTER — Emergency Department (HOSPITAL_BASED_OUTPATIENT_CLINIC_OR_DEPARTMENT_OTHER)
Admission: EM | Admit: 2023-05-18 | Discharge: 2023-05-18 | Disposition: A | Discharge status: Home / Self Care | Payer: Commercial Managed Care - HMO | Attending: Emergency Medicine | Admitting: Emergency Medicine

## 2023-05-18 ENCOUNTER — Other Ambulatory Visit: Payer: Self-pay

## 2023-05-18 DIAGNOSIS — I10 Essential (primary) hypertension: Secondary | ICD-10-CM | POA: Insufficient documentation

## 2023-05-18 DIAGNOSIS — M109 Gout, unspecified: Secondary | ICD-10-CM | POA: Insufficient documentation

## 2023-05-18 DIAGNOSIS — M79671 Pain in right foot: Secondary | ICD-10-CM | POA: Diagnosis present

## 2023-05-18 DIAGNOSIS — Z79899 Other long term (current) drug therapy: Secondary | ICD-10-CM | POA: Insufficient documentation

## 2023-05-18 DIAGNOSIS — Z8739 Personal history of other diseases of the musculoskeletal system and connective tissue: Secondary | ICD-10-CM

## 2023-05-18 MED ORDER — DEXAMETHASONE SODIUM PHOSPHATE 10 MG/ML IJ SOLN
10.0000 mg | Freq: Once | INTRAMUSCULAR | Status: AC
  Administered 2023-05-18: 10 mg via INTRAMUSCULAR
  Filled 2023-05-18: qty 1

## 2023-05-18 MED ORDER — INDOMETHACIN 50 MG PO CAPS: 50.0000 mg | ORAL_CAPSULE | Freq: Three times a day (TID) | ORAL | 0 refills | Status: AC | PRN

## 2023-05-18 MED ORDER — LIDOCAINE 5 % EX PTCH: 1.0000 | MEDICATED_PATCH | CUTANEOUS | 0 refills | Status: AC

## 2023-05-18 MED ORDER — KETOROLAC TROMETHAMINE 60 MG/2ML IM SOLN
60.0000 mg | Freq: Once | INTRAMUSCULAR | Status: AC
Start: 2023-05-18 — End: 2023-05-18
  Administered 2023-05-18: 60 mg via INTRAMUSCULAR
  Filled 2023-05-18: qty 2

## 2023-05-18 NOTE — ED Provider Notes (Signed)
[place] [facility] Provider Note   CSN: [id number] Arrival date & time: 05/18/23  1650     History  Chief Complaint  Patient presents with   Foot Pain    Kaitlin Haney is a 57 y.o. female.  HPI     57yo female hypertension, gout presents with concern for foot pain.   Reports that she has a history of gout and that her symptoms are similar to her prior gout flares.  Reports that her gout does not always happen at the first MTP, sometimes happens in the midfoot which is where her pain is now.  She was recently in [facility] celebrating a birthday had been walking more and is not sure if that exacerbated it.  She tried to pay attention to the amount of meat she was eating.  Does not have any other calf pain or swelling, chest pain, shortness of breath, fever, chills, nausea vomiting or other symptoms.  No history of diabetes or renal problems.  She did have a hemoglobin A1c of 6 in the past.  No falls, trauma.   Past Medical History:  Diagnosis Date   Gout    Hypertension      Home Medications Prior to Admission medications   Medication Sig Start Date End Date Taking? Authorizing Provider  indomethacin (INDOCIN) 50 MG capsule Take 1 capsule (50 mg total) by mouth 3 (three) times daily as needed for up to 7 days. 05/18/23 05/25/23 Yes Tiburcio Linder, Denny Peon, MD  lidocaine (LIDODERM) 5 % Place 1 patch onto the skin daily. Remove & Discard patch within 12 hours or as directed by MD 05/18/23  Yes Alvira Monday, MD  amLODipine (NORVASC) 5 MG tablet Take 1 tablet (5 mg total) by mouth daily. 07/07/19   Tilden Fossa, MD  cyclobenzaprine (FLEXERIL) 10 MG tablet Take 1 tablet (10 mg total) by mouth 2 (two) times daily as needed for muscle spasms. 04/26/23   Ernie Avena, MD      Allergies    Patient has no known allergies.    Review of Systems   Review of Systems  Physical Exam Updated Vital Signs BP (!) 150/82 (BP Location: Left Arm)    Pulse 97   Temp 98.3 F (36.8 C)   Resp 18   Ht 5\' 9"  (1.753 m)   Wt (!) 138.3 kg   LMP 12/14/2015   SpO2 94%   BMI 45.04 kg/m  Physical Exam Vitals and nursing note reviewed.  Constitutional:      General: She is not in acute distress.    Appearance: Normal appearance. She is not ill-appearing, toxic-appearing or diaphoretic.  HENT:     Head: Normocephalic.  Eyes:     Conjunctiva/sclera: Conjunctivae normal.  Cardiovascular:     Rate and Rhythm: Normal rate and regular rhythm.     Pulses: Normal pulses.  Pulmonary:     Effort: Pulmonary effort is normal. No respiratory distress.  Musculoskeletal:        General: Tenderness (right midfoot) present. No deformity or signs of injury.     Cervical back: No rigidity.     Comments: No erythema, normal pulses  Skin:    General: Skin is warm and dry.     Coloration: Skin is not jaundiced or pale.  Neurological:     General: No focal deficit present.     Mental Status: She is alert and oriented to person, place, and time.     ED Results / Procedures /  Treatments   Labs (all labs ordered are listed, but only abnormal results are displayed) Labs Reviewed - No data to display  EKG None  Radiology No results found.  Procedures Procedures    Medications Ordered in ED Medications  ketorolac (TORADOL) injection 60 mg (has no administration in time range)  dexamethasone (DECADRON) injection 10 mg (has no administration in time range)    ED Course/ Medical Decision Making/ A&P                                   57yo female hypertension, gout presents with concern for foot pain.   Normal pulses bilaterally, no sign of acute ischemia.  No pain or swelling of the calf, similar symptoms in the past, history and presentation is not consistent with a DVT.  No fever, erythema, low suspicion for septic arthritis.  Discussed the possibility of an x-ray to evaluate for signs of stress fracture or other abnormality in the setting  of her walking a lot--however she declines and feels this presentation is similar to her prior gout flares.  Reviewed prior lab work including creatinine from May which was normal, hemoglobin A1c is 6.  In the past, she has improved with Decadron, toradol and rx for indomethacin on her presentation to the emergency department and was ordered the same today.  Discussed reasons to return and need for PCP follow up. Patient discharged in stable condition with understanding of reasons to return.         Final Clinical Impression(s) / ED Diagnoses Final diagnoses:  Right foot pain  History of gout    Rx / DC Orders ED Discharge Orders          Ordered    indomethacin (INDOCIN) 50 MG capsule  3 times daily PRN        05/18/23 1711    lidocaine (LIDODERM) 5 %  Every 24 hours        05/18/23 1711              Alvira Monday, MD 05/18/23 1720

## 2023-05-18 NOTE — ED Triage Notes (Signed)
Pt reports "I think I have a gout flare-up." She reports right foot pain that started yesterday. She reports a lot of walking recently. Denies injury, no visible bruising or swelling. She arrives with Lidocaine patch on top of her foot.

## 2023-05-18 NOTE — ED Notes (Signed)
Discharge instructions reviewed with patient. Patient verbalizes understanding, no further questions at this time. Medications/prescriptions and follow up information provided. No acute distress noted at time of departure.  

## 2023-06-13 ENCOUNTER — Emergency Department (HOSPITAL_BASED_OUTPATIENT_CLINIC_OR_DEPARTMENT_OTHER)
Admission: EM | Admit: 2023-06-13 | Discharge: 2023-06-13 | Disposition: A | Discharge status: Home / Self Care | Payer: Commercial Managed Care - HMO | Attending: Emergency Medicine | Admitting: Emergency Medicine

## 2023-06-13 ENCOUNTER — Other Ambulatory Visit: Payer: Self-pay

## 2023-06-13 ENCOUNTER — Other Ambulatory Visit (HOSPITAL_BASED_OUTPATIENT_CLINIC_OR_DEPARTMENT_OTHER): Payer: Self-pay

## 2023-06-13 ENCOUNTER — Encounter (HOSPITAL_BASED_OUTPATIENT_CLINIC_OR_DEPARTMENT_OTHER): Payer: Self-pay

## 2023-06-13 DIAGNOSIS — Z79899 Other long term (current) drug therapy: Secondary | ICD-10-CM | POA: Diagnosis not present

## 2023-06-13 DIAGNOSIS — I10 Essential (primary) hypertension: Secondary | ICD-10-CM | POA: Insufficient documentation

## 2023-06-13 DIAGNOSIS — M25551 Pain in right hip: Secondary | ICD-10-CM | POA: Diagnosis present

## 2023-06-13 DIAGNOSIS — M5431 Sciatica, right side: Secondary | ICD-10-CM | POA: Diagnosis not present

## 2023-06-13 MED ORDER — CYCLOBENZAPRINE HCL 10 MG PO TABS
10.0000 mg | ORAL_TABLET | Freq: Two times a day (BID) | ORAL | 0 refills | Status: AC | PRN
  Filled 2023-06-13: qty 20, 10d supply, fill #0

## 2023-06-13 MED ORDER — METHYLPREDNISOLONE 4 MG PO TBPK
ORAL_TABLET | ORAL | 0 refills | Status: DC
  Filled 2023-06-13: qty 21, 6d supply, fill #0

## 2023-06-13 NOTE — ED Triage Notes (Signed)
Pt reports right hip pain and is pointing to her right buttocks area - onset about a month ago. She reports a flare up every once and a while. She denies injury or fall. She has a lidocaine patch on right now. She is A&OX4, ambulatory with independent steady gait.

## 2023-06-13 NOTE — ED Provider Notes (Signed)
[place] [facility] Provider Note   CSN: [id number] Arrival date & time: 06/13/23  1529     History  Chief Complaint  Patient presents with   Hip Pain    Kaitlin Haney is a 57 y.o. female.  Patient having pain in her right buttocks radiating down her leg at times.  History of the same.  She has been using lidocaine patches.  She denies any trauma.  No loss of bowel or bladder.  No back pain.  No weakness numbness otherwise.  No fever or chills.  History of hypertension.  The history is provided by the patient.       Home Medications Prior to Admission medications   Medication Sig Start Date End Date Taking? Authorizing Provider  cyclobenzaprine (FLEXERIL) 10 MG tablet Take 1 tablet (10 mg total) by mouth 2 (two) times daily as needed for muscle spasms. 06/13/23  Yes Elizaveta Mattice, DO  methylPREDNISolone (MEDROL DOSEPAK) 4 MG TBPK tablet Follow package insert 06/13/23  Yes Ardelle Haliburton, DO  amLODipine (NORVASC) 5 MG tablet Take 1 tablet (5 mg total) by mouth daily. 07/07/19   Kaitlin Fossa, MD  lidocaine (LIDODERM) 5 % Place 1 patch onto the skin daily. Remove & Discard patch within 12 hours or as directed by MD 05/18/23   Kaitlin Monday, MD      Allergies    Patient has no known allergies.    Review of Systems   Review of Systems  Physical Exam Updated Vital Signs BP (!) 140/90 (BP Location: Left Arm)   Pulse 74   Temp 98.3 F (36.8 C) (Oral)   Resp 18   Ht 5\' 9"  (1.753 m)   Wt (!) 144.6 kg   LMP 12/14/2015   SpO2 97%   BMI 47.08 kg/m  Physical Exam Vitals and nursing note reviewed.  Constitutional:      General: She is not in acute distress.    Appearance: She is well-developed.  HENT:     Head: Normocephalic and atraumatic.  Eyes:     Extraocular Movements: Extraocular movements intact.     Conjunctiva/sclera: Conjunctivae normal.     Pupils: Pupils are equal, round, and reactive to light.  Cardiovascular:      Rate and Rhythm: Normal rate and regular rhythm.     Pulses: Normal pulses.     Heart sounds: No murmur heard. Pulmonary:     Effort: Pulmonary effort is normal. No respiratory distress.     Breath sounds: Normal breath sounds.  Abdominal:     Palpations: Abdomen is soft.     Tenderness: There is no abdominal tenderness.  Musculoskeletal:        General: Tenderness present. No swelling.     Cervical back: Neck supple.     Comments: Tenderness in the right gluteal area  Skin:    General: Skin is warm and dry.     Capillary Refill: Capillary refill takes less than 2 seconds.  Neurological:     General: No focal deficit present.     Mental Status: She is alert.     Sensory: No sensory deficit.     Motor: No weakness.  Psychiatric:        Mood and Affect: Mood normal.     ED Results / Procedures / Treatments   Labs (all labs ordered are listed, but only abnormal results are displayed) Labs Reviewed - No data to display  EKG None  Radiology No results found.  Procedures Procedures    Medications Ordered in ED Medications - No data to display  ED Course/ Medical Decision Making/ A&P                                 Medical Decision Making Risk Prescription drug management.   [name] is here with right buttock/hip pain.  No trauma history.  Unremarkable vitals.  No fever.  Neurovascular neuromuscular intact on exam.  Have no concern for blood clot or arterial process.  She is describing sciatic symptoms.  She got reproducible tenderness in her right buttock area.  Intermittent pain down the legs at times.  She is having no back pain.  No loss of bowel or bladder.  Have no concern for spinal cord process or cauda equina.  She is ambulatory.  Very well-appearing.  Will do Medrol Dosepak and Flexeril and have her follow-up with her primary care doctor.  She has been using lidocaine patches.  Recommend Tylenol as well.  Discharged in good condition.  This chart  was dictated using voice recognition software.  Despite best efforts to proofread,  errors can occur which can change the documentation meaning.         Final Clinical Impression(s) / ED Diagnoses Final diagnoses:  Sciatica of right side    Rx / DC Orders ED Discharge Orders          Ordered    methylPREDNISolone (MEDROL DOSEPAK) 4 MG TBPK tablet        06/13/23 1703    cyclobenzaprine (FLEXERIL) 10 MG tablet  2 times daily PRN        06/13/23 1703              Kaitlin Norfolk, DO 06/13/23 1705

## 2023-06-13 NOTE — ED Notes (Signed)

## 2023-06-13 NOTE — Discharge Instructions (Signed)
Recommend 1000 mg of Tylenol every 6 hours as needed for pain.  Take Medrol Dosepak as prescribed.  Flexeril is a muscle relaxant.  This medication is sedating.  Do not mix with alcohol or drugs or dangerous activities including driving.  Follow-up with your primary care doctor.

## 2023-09-20 ENCOUNTER — Encounter (HOSPITAL_BASED_OUTPATIENT_CLINIC_OR_DEPARTMENT_OTHER): Payer: Self-pay

## 2023-09-20 ENCOUNTER — Other Ambulatory Visit: Payer: Self-pay

## 2023-09-20 ENCOUNTER — Emergency Department (HOSPITAL_BASED_OUTPATIENT_CLINIC_OR_DEPARTMENT_OTHER)
Admission: EM | Admit: 2023-09-20 | Discharge: 2023-09-20 | Disposition: A | Discharge status: Home / Self Care | Attending: Emergency Medicine | Admitting: Emergency Medicine

## 2023-09-20 DIAGNOSIS — M10072 Idiopathic gout, left ankle and foot: Secondary | ICD-10-CM | POA: Diagnosis not present

## 2023-09-20 DIAGNOSIS — M109 Gout, unspecified: Secondary | ICD-10-CM

## 2023-09-20 DIAGNOSIS — M79672 Pain in left foot: Secondary | ICD-10-CM | POA: Diagnosis present

## 2023-09-20 MED ORDER — INDOMETHACIN 25 MG PO CAPS: 25.0000 mg | ORAL_CAPSULE | Freq: Three times a day (TID) | ORAL | 0 refills | Status: AC

## 2023-09-20 MED ORDER — METHYLPREDNISOLONE SODIUM SUCC 125 MG IJ SOLR
125.0000 mg | Freq: Once | INTRAMUSCULAR | Status: AC
  Administered 2023-09-20: 125 mg via INTRAMUSCULAR
  Filled 2023-09-20: qty 2

## 2023-09-20 MED ORDER — KETOROLAC TROMETHAMINE 60 MG/2ML IM SOLN
30.0000 mg | Freq: Once | INTRAMUSCULAR | Status: AC
  Administered 2023-09-20: 30 mg via INTRAMUSCULAR
  Filled 2023-09-20: qty 2

## 2023-09-20 NOTE — ED Provider Notes (Signed)
 [place] [facility] Provider Note   CSN: [id number] Arrival date & time: 09/20/23  1191     History  Chief Complaint  Patient presents with   Foot Pain    Kaitlin Haney is a 58 y.o. female.  Patient complains of pain in her right and left foot.  Patient reports that she has gout in her right foot but she is now having a flareup of gout in her left toe.  Patient also reports that she suffers with chronic sciatica.  Patient is scheduled to see her primary care physician for recheck tomorrow.  Patient reports that she is not diabetic.  Patient takes metformin to lose weight and her A1c's have been good.  Patient reports she normally responds best to a shot of steroids and she is requesting this.  Patient denies any fever or chills she has not had any redness.   Foot Pain       Home Medications Prior to Admission medications   Medication Sig Start Date End Date Taking? Authorizing Provider  amLODipine (NORVASC) 5 MG tablet Take 1 tablet (5 mg total) by mouth daily. 07/07/19   Tilden Fossa, MD  cyclobenzaprine (FLEXERIL) 10 MG tablet Take 1 tablet (10 mg total) by mouth 2 (two) times daily as needed for muscle spasms. 06/13/23   Curatolo, Adam, DO  lidocaine (LIDODERM) 5 % Place 1 patch onto the skin daily. Remove & Discard patch within 12 hours or as directed by MD 05/18/23   Alvira Monday, MD  methylPREDNISolone (MEDROL DOSEPAK) 4 MG TBPK tablet Follow package insert 06/13/23   Virgina Norfolk, DO      Allergies    Patient has no known allergies.    Review of Systems   Review of Systems  All other systems reviewed and are negative.   Physical Exam Updated Vital Signs BP 132/72   Pulse 70   Temp 98 F (36.7 C) (Oral)   Resp 18   Ht 5\' 9"  (1.753 m)   Wt 136.1 kg   LMP 12/14/2015   SpO2 100%   BMI 44.30 kg/m  Physical Exam Vitals and nursing note reviewed.  Constitutional:      Appearance: She is well-developed.  HENT:      Head: Normocephalic.  Cardiovascular:     Rate and Rhythm: Normal rate.  Pulmonary:     Effort: Pulmonary effort is normal.  Abdominal:     General: There is no distension.  Musculoskeletal:        General: Normal range of motion.     Comments: Swelling left first toe, neurovascular neurosensory intact,  Skin:    General: Skin is warm.  Neurological:     General: No focal deficit present.     Mental Status: She is alert and oriented to person, place, and time.     ED Results / Procedures / Treatments   Labs (all labs ordered are listed, but only abnormal results are displayed) Labs Reviewed - No data to display  EKG None  Radiology No results found.  Procedures Procedures    Medications Ordered in ED Medications  methylPREDNISolone sodium succinate (SOLU-MEDROL) 125 mg/2 mL injection 125 mg (has no administration in time range)  ketorolac (TORADOL) injection 30 mg (has no administration in time range)    ED Course/ Medical Decision Making/ A&P  Medical Decision Making Patient reports she has a history of gout.  Amount and/or Complexity of Data Reviewed External Data Reviewed: notes.    Details: Previous notes reviewed patient does have a history of gout.  She was treated here with steroid injection and Toradol with good relief.  Patient has been on indomethacin in the past.  Risk Prescription drug management. Risk Details: Patient is given a prescription for Indocin she is advised to follow-up with her primary care physician tomorrow for recheck.           Final Clinical Impression(s) / ED Diagnoses Final diagnoses:  Gouty arthritis of left great toe    Rx / DC Orders ED Discharge Orders          Ordered    indomethacin (INDOCIN) 25 MG capsule  3 times daily with meals        09/20/23 0934          An After Visit Summary was printed and given to the patient.     Elson Areas, [place] 09/20/23  1610    Pricilla Loveless, MD 09/20/23 1440

## 2023-09-20 NOTE — ED Triage Notes (Signed)
 States she drank some lemonade and thinks it flared up her gout. C/o right foot pain and left great toe pain. Also c/o sciatic nerve pain. States sees PCP tomorrow  Hx of gout.

## 2023-09-28 ENCOUNTER — Ambulatory Visit: Payer: Commercial Managed Care - HMO | Admitting: Dietician

## 2023-10-11 ENCOUNTER — Emergency Department (HOSPITAL_BASED_OUTPATIENT_CLINIC_OR_DEPARTMENT_OTHER)
Admission: EM | Admit: 2023-10-11 | Discharge: 2023-10-11 | Disposition: A | Discharge status: Home / Self Care | Attending: Emergency Medicine | Admitting: Emergency Medicine

## 2023-10-11 ENCOUNTER — Encounter (HOSPITAL_BASED_OUTPATIENT_CLINIC_OR_DEPARTMENT_OTHER): Payer: Self-pay | Admitting: Emergency Medicine

## 2023-10-11 ENCOUNTER — Other Ambulatory Visit: Payer: Self-pay

## 2023-10-11 DIAGNOSIS — M10041 Idiopathic gout, right hand: Secondary | ICD-10-CM | POA: Insufficient documentation

## 2023-10-11 DIAGNOSIS — Z79899 Other long term (current) drug therapy: Secondary | ICD-10-CM | POA: Insufficient documentation

## 2023-10-11 DIAGNOSIS — M79641 Pain in right hand: Secondary | ICD-10-CM | POA: Diagnosis present

## 2023-10-11 DIAGNOSIS — I10 Essential (primary) hypertension: Secondary | ICD-10-CM | POA: Diagnosis not present

## 2023-10-11 MED ORDER — KETOROLAC TROMETHAMINE 15 MG/ML IJ SOLN
15.0000 mg | Freq: Once | INTRAMUSCULAR | Status: AC
  Administered 2023-10-11: 15 mg via INTRAMUSCULAR
  Filled 2023-10-11: qty 1

## 2023-10-11 NOTE — ED Provider Notes (Signed)
 [place] [facility] Provider Note   CSN: [id number] Arrival date & time: 10/11/23  1329     History  Chief Complaint  Patient presents with   Joint Swelling   Wrist Pain    Kaitlin Haney is a 58 y.o. female with history of gout, hypertension, presents with concern for pain in her right hand that started yesterday.  States this feels similar to previous gout flares.  She denies any wounds or cuts in her right upper extremity or fevers.  Denies any falls or any trauma to the hand.  She states she takes allopurinol daily for gout prevention.  States the Toradol given at her last visit for gout was very helpful for the pain.   Wrist Pain       Home Medications Prior to Admission medications   Medication Sig Start Date End Date Taking? Authorizing Provider  amLODipine (NORVASC) 5 MG tablet Take 1 tablet (5 mg total) by mouth daily. 07/07/19   Tilden Fossa, MD  cyclobenzaprine (FLEXERIL) 10 MG tablet Take 1 tablet (10 mg total) by mouth 2 (two) times daily as needed for muscle spasms. 06/13/23   Curatolo, Adam, DO  lidocaine (LIDODERM) 5 % Place 1 patch onto the skin daily. Remove & Discard patch within 12 hours or as directed by MD 05/18/23   Alvira Monday, MD  methylPREDNISolone (MEDROL DOSEPAK) 4 MG TBPK tablet Follow package insert 06/13/23   Virgina Norfolk, DO      Allergies    Patient has no known allergies.    Review of Systems   Review of Systems  Musculoskeletal:        Right hand pain    Physical Exam Updated Vital Signs BP 121/74   Pulse 71   Temp 97.8 F (36.6 C)   Resp 18   Ht 5\' 9"  (1.753 m)   Wt (!) 149.7 kg   LMP 12/14/2015   SpO2 98%   BMI 48.73 kg/m  Physical Exam Vitals and nursing note reviewed.  Constitutional:      Appearance: Normal appearance.  HENT:     Head: Atraumatic.  Cardiovascular:     Rate and Rhythm: Normal rate and regular rhythm.     Comments: Radial pulse 2+  bilaterally Pulmonary:     Effort: Pulmonary effort is normal.     Comments: Talking in full sentences on room air without difficulty Musculoskeletal:     Comments: Right upper extremity:  General Mild edema over the fifth metacarpal.  No erythema, contusions, open wounds   Palpation Tender over the medial side of the wrist into the fifth metacarpal.  No tenderness to palpation of the 1st through 4th metacarpals or phalanges.  Nontender over the proximal, middle, distal fifth phalanx No snuffbox TTP   ROM Full flexion extension at the wrist Full flexion extension at the 1st through 5th MCPs, PIPs, DIPs   Sensation: Sensation intact throughout the 1st-5th digits   Neurological:     General: No focal deficit present.     Mental Status: She is alert.  Psychiatric:        Mood and Affect: Mood normal.        Behavior: Behavior normal.     ED Results / Procedures / Treatments   Labs (all labs ordered are listed, but only abnormal results are displayed) Labs Reviewed - No data to display  EKG None  Radiology No results found.  Procedures Procedures    Medications Ordered in ED  Medications  ketorolac (TORADOL) 15 MG/ML injection 15 mg (15 mg Intramuscular Given 10/11/23 1404)    ED Course/ Medical Decision Making/ A&P                                 Medical Decision Making    Differential diagnosis includes but is not limited to fracture, sprain, gout, septic arthritis, osteoarthritis, rheumatoid arthritis  ED Course:  Patient well-appearing, stable vital signs.  She has tenderness palpation of the medial side of the right wrist and into the fifth metacarpal.  There is mild edema over this area without any erythema.  She has full range of motion in the wrist and 1st through 5th MCPs, PIPs, DIPs.  No abrasions or wounds of the right upper extremity, no overlying erythema, concern for infectious etiology or septic arthritis.  She denies any trauma to the area, no  indication for x-ray imaging.  Presentation seems that it could  be consistent with arthritis vs gout flare. However, patient states this feels similar to previous gout flares, and so will treat her for this. She reports that the Toradol given to her at the last visit for gout was very helpful.  CMP taken by her PCP on 09/21/2023 was reviewed, she has normal creatinine.  She was given a dose of Toradol here today for pain. Since this was beneficial for her, recommended continuing with Aleve at home until symptoms are resolved.  Stable and appropriate for discharge home at this time  Impression: Gout flare of right hand  Disposition:  The patient was discharged home with instructions to take Aleve for pain until symptoms resolve.  Follow-up with PCP if symptoms not improved within the next 3 days. Return precautions given.  External records from outside source obtained and reviewed including CMP from 09/21/2023 reviewed, within normal limits.  ER note from 09/20/2023 reviewed where she was seen for gout flare.              Final Clinical Impression(s) / ED Diagnoses Final diagnoses:  Acute idiopathic gout of right hand    Rx / DC Orders ED Discharge Orders     None         Arabella Merles, PA-C 10/11/23 1442    Ernie Avena, MD 10/11/23 1511

## 2023-10-11 NOTE — ED Triage Notes (Addendum)
 Pt reports hx of gout, started having RT wrist swelling and pain yesterday, states it feels like a gout flare, denies any injury or trauma, takes Allopurinol 100 mg every day as prescribed

## 2023-10-11 NOTE — Discharge Instructions (Signed)
 You likely have a gout flare.  You have been given a shot of pain medicine called Toradol to help with your pain.   At home you may take 500mg  naproxen (Aleve) every 12 hours as needed for pain. You may take this starting after 8pm tonight. Typically, pain should start to resolve within about 7-10 days. Discontinue taking this medication when your pain has resolved.  Maintaining a healthy weight and healthy blood pressure can help reduce the risk of flares. Limit consumption of alcohol, sweetened beverages (like sodas and sweet tea), red meats, organ meats (liver), and shellfish. Consumption of these foods can trigger flares.   Please follow-up with your PCP if your symptoms do not start to improve within the next 3 days.  Return to the ER if you develop fever or chills, uncontrolled pain, any other new or concerning symptoms

## 2023-11-08 ENCOUNTER — Other Ambulatory Visit: Payer: Self-pay

## 2023-11-08 ENCOUNTER — Emergency Department (HOSPITAL_BASED_OUTPATIENT_CLINIC_OR_DEPARTMENT_OTHER)
Admission: EM | Admit: 2023-11-08 | Discharge: 2023-11-08 | Disposition: A | Discharge status: Home / Self Care | Attending: Emergency Medicine | Admitting: Emergency Medicine

## 2023-11-08 ENCOUNTER — Encounter (HOSPITAL_BASED_OUTPATIENT_CLINIC_OR_DEPARTMENT_OTHER): Payer: Self-pay | Admitting: Emergency Medicine

## 2023-11-08 DIAGNOSIS — M542 Cervicalgia: Secondary | ICD-10-CM | POA: Insufficient documentation

## 2023-11-08 DIAGNOSIS — I10 Essential (primary) hypertension: Secondary | ICD-10-CM | POA: Diagnosis not present

## 2023-11-08 DIAGNOSIS — Z79899 Other long term (current) drug therapy: Secondary | ICD-10-CM | POA: Diagnosis not present

## 2023-11-08 MED ORDER — METHOCARBAMOL 500 MG PO TABS: 500.0000 mg | ORAL_TABLET | Freq: Two times a day (BID) | ORAL | 0 refills | Status: AC

## 2023-11-08 NOTE — Discharge Instructions (Addendum)
 You were seen today for neck pain.  I have low suspicion for any emergent reasons for this as it appears to be a muscle strain caused during sleep.  Recommend he continue to use over-the-counter pain medication including Tylenol  and ibuprofen  for pain relief.  However and prescribing a muscle relaxer which you can also use not in conjunction with any other muscle relaxer to help with pain. Take Tylenol  (acetominophen)  650mg  every 4-6 hours, as needed for pain or fever. Do not take more than 4,000 mg in a 24-hour period. As this may cause liver damage. While this is rare, if you begin to develop yellowing of the skin or eyes, stop taking and return to ER immediately. Take Ibuprofen  400mg  every 4-6 hours for pain or fever, not exceeding 3,200 mg per day as more than 3,200mg  can cause Stomach irritation, dizziness, kidney issues with long-term use. Please take Robaxin , 500 mg up to twice a day as needed for muscle spasm, this is a muscle relaxer, it may cause generalized weakness, sleepiness and you should not drive or do important things while taking this medication. You can also use over-the-counter lidocaine  patches and/or Salonpas pads as well as heating pads over the area of the affected side of the neck to help relieve pain.  Follow-up with PCP for any for persistent symptoms.  If you begin to have any new or worsening symptoms including fever, numbness, weakness, tingling, shortness of breath, visual changes, return to ED for further evaluation.

## 2023-11-08 NOTE — ED Triage Notes (Signed)
 Pt c/o RT side neck pain for the last 2 mornings; pain decreased throughout the day yesterday, but has not changed today even after ibuprofen  at 1130; no injury; pain increases when she turns head to the RT

## 2023-11-08 NOTE — ED Provider Notes (Signed)
 [place] [facility] AT [facility] Provider Note   CSN: [id number] Arrival date & time: 11/08/23  1535     History  Chief Complaint  Patient presents with   Neck Pain    Kaitlin Haney is a 58 y.o. female.   Neck Pain This patient is a 58 year old female who presents to the ED for concern of right-sided neck pain that she experienced after waking up 2 days ago, has been relieved with Motrin  but comes in today due to pain persistent despite Motrin .  Pain is very focal, noted to be at the posterior base of the skull.  Described as "ache".  No radiation.  Reports initially having a headache but currently is not experiencing any headache.  Denies fever, vertigo, vision changes, ear pain, sore throat, dysphagia, numbness, weakness, tingling, trauma/injury.     Home Medications Prior to Admission medications   Medication Sig Start Date End Date Taking? Authorizing Provider  methocarbamol  (ROBAXIN ) 500 MG tablet Take 1 tablet (500 mg total) by mouth 2 (two) times daily. 11/08/23  Yes Hayes Lipps, PA-C  amLODipine  (NORVASC ) 5 MG tablet Take 1 tablet (5 mg total) by mouth daily. 07/07/19   Kelsey Patricia, MD  cyclobenzaprine  (FLEXERIL ) 10 MG tablet Take 1 tablet (10 mg total) by mouth 2 (two) times daily as needed for muscle spasms. 06/13/23   Curatolo, Adam, DO  lidocaine  (LIDODERM ) 5 % Place 1 patch onto the skin daily. Remove & Discard patch within 12 hours or as directed by MD 05/18/23   Scarlette Currier, MD  methylPREDNISolone  (MEDROL  DOSEPAK) 4 MG TBPK tablet Follow package insert 06/13/23   Lowery Rue, DO      Allergies    Patient has no known allergies.    Review of Systems   Review of Systems  Musculoskeletal:  Positive for neck pain.  All other systems reviewed and are negative.   Physical Exam Updated Vital Signs BP 115/72   Pulse 71   Temp 97.6 F (36.4 C)   Resp 20   Ht 5\' 9"  (1.753 m)   Wt (!) 149.7 kg   LMP 12/14/2015   SpO2  97%   BMI 48.74 kg/m  Physical Exam Vitals and nursing note reviewed.  Constitutional:      General: She is not in acute distress.    Appearance: Normal appearance. She is not ill-appearing.  HENT:     Head: Normocephalic and atraumatic.  Eyes:     General: No scleral icterus.       Right eye: No discharge.        Left eye: No discharge.     Extraocular Movements: Extraocular movements intact.     Conjunctiva/sclera: Conjunctivae normal.     Pupils: Pupils are equal, round, and reactive to light.  Cardiovascular:     Rate and Rhythm: Normal rate and regular rhythm.     Pulses: Normal pulses.     Heart sounds: Normal heart sounds. No murmur heard.    No friction rub. No gallop.  Pulmonary:     Effort: Pulmonary effort is normal. No respiratory distress.     Breath sounds: Normal breath sounds. No stridor. No wheezing, rhonchi or rales.  Abdominal:     General: Abdomen is flat.     Palpations: Abdomen is soft.     Tenderness: There is no abdominal tenderness.  Musculoskeletal:     Cervical back: Normal range of motion. Tenderness (Focal tenderness noted along the trapezius muscle on the right  side very focally at the base of the skull.) present. No rigidity.  Lymphadenopathy:     Cervical: No cervical adenopathy.  Skin:    General: Skin is warm and dry.     Findings: No bruising.  Neurological:     General: No focal deficit present.     Mental Status: She is alert and oriented to person, place, and time. Mental status is at baseline.     Cranial Nerves: No cranial nerve deficit.     Sensory: No sensory deficit.     Motor: No weakness.     Coordination: Coordination normal.     Gait: Gait normal.  Psychiatric:        Mood and Affect: Mood normal.     ED Results / Procedures / Treatments   Labs (all labs ordered are listed, but only abnormal results are displayed) Labs Reviewed - No data to display  EKG None  Radiology No results found.  Procedures Procedures     Medications Ordered in ED Medications - No data to display  ED Course/ Medical Decision Making/ A&P                                 Medical Decision Making  This patient is a 58 year old female who presents to the ED for concern of right-sided neck pain that she experienced after waking up 2 days ago.  Relieved by Motrin  however today is only slightly relieved.    On physical exam, patient is in no acute distress, afebrile, alert and orient x 4, speaking in full sentences, nontachypneic, nontachycardic.  Patient noted to be mildly tender to palpation on the right side of her neck at the base of the skull posteriorly.  She states that the pain is actually relieved more when massage.  Full range of motion of the neck, supple.  No radicular symptoms.  Unremarkable exam otherwise.  With pain being very focal and not accompanied with any neurological symptoms, believe symptoms likely musculoskeletal.  Will have her take Robaxin  as a muscle relaxer as well as continue with over-the-counter medications including Tylenol  and ibuprofen  as well as Salonpas pads/lidocaine  patches.  Will have her follow-up with PCP for any persistent symptoms.  Patient vital signs have remained stable throughout the course of patient's time in the ED. Low suspicion for any other emergent pathology at this time. I believe this patient is safe to be discharged. Provided strict return to ER precautions. Patient expressed agreement and understanding of plan. All questions were answered.  Differential diagnoses prior to evaluation: The emergent differential diagnosis includes, but is not limited to,  Muscle strain, torticollis, cervical fracture, tumor, herniation . This is not an exhaustive differential.   Past Medical History / Co-morbidities / Social History: Hypertension, gout  Additional history: Chart reviewed. Pertinent results include:   Patient was seen on 10/11/2023 as well as on 09/20/2023 for gout, provided  Toradol  and was told to follow with PCP.  Seen by PCP on 10/19/2023 by PCP.  Lab Tests/Imaging studies: No tests or imaging were needed at this time.  Considered CT imaging but do not believe is warranted at this time.  Medications: I ordered medication including Robaxin .  I have reviewed the patients home medicines and have made adjustments as needed.  Disposition: After consideration of the diagnostic results and the patients response to treatment, I feel that the patient would benefit from discharge treatment as above.  emergency department workup does not suggest an emergent condition requiring admission or immediate intervention beyond what has been performed at this time. The plan is: Robaxin , over-the-counter pain relief, follow-up with PCP for persistent symptoms, return for the ER for any new or worsening symptoms. The patient is safe for discharge and has been instructed to return immediately for worsening symptoms, change in symptoms or any other concerns.   Final Clinical Impression(s) / ED Diagnoses Final diagnoses:  Neck pain    Rx / DC Orders ED Discharge Orders          Ordered    methocarbamol  (ROBAXIN ) 500 MG tablet  2 times daily        11/08/23 1731              Hayes Lipps, [place] 11/08/23 1732    Sallyanne Creamer, DO 11/11/23 2126

## 2023-11-18 ENCOUNTER — Encounter: Admitting: Obstetrics and Gynecology

## 2023-12-14 ENCOUNTER — Encounter (HOSPITAL_BASED_OUTPATIENT_CLINIC_OR_DEPARTMENT_OTHER): Payer: Self-pay | Admitting: Emergency Medicine

## 2023-12-14 ENCOUNTER — Emergency Department (HOSPITAL_BASED_OUTPATIENT_CLINIC_OR_DEPARTMENT_OTHER)
Admission: EM | Admit: 2023-12-14 | Discharge: 2023-12-14 | Disposition: A | Discharge status: Home / Self Care | Attending: Emergency Medicine | Admitting: Emergency Medicine

## 2023-12-14 ENCOUNTER — Other Ambulatory Visit: Payer: Self-pay

## 2023-12-14 DIAGNOSIS — Z79899 Other long term (current) drug therapy: Secondary | ICD-10-CM | POA: Diagnosis not present

## 2023-12-14 DIAGNOSIS — I10 Essential (primary) hypertension: Secondary | ICD-10-CM | POA: Insufficient documentation

## 2023-12-14 DIAGNOSIS — Z8739 Personal history of other diseases of the musculoskeletal system and connective tissue: Secondary | ICD-10-CM

## 2023-12-14 DIAGNOSIS — M109 Gout, unspecified: Secondary | ICD-10-CM | POA: Insufficient documentation

## 2023-12-14 DIAGNOSIS — M79671 Pain in right foot: Secondary | ICD-10-CM | POA: Diagnosis present

## 2023-12-14 MED ORDER — KETOROLAC TROMETHAMINE 30 MG/ML IJ SOLN
30.0000 mg | Freq: Once | INTRAMUSCULAR | Status: AC
  Administered 2023-12-14: 30 mg via INTRAMUSCULAR
  Filled 2023-12-14: qty 1

## 2023-12-14 MED ORDER — INDOMETHACIN 25 MG PO CAPS: 25.0000 mg | ORAL_CAPSULE | Freq: Three times a day (TID) | ORAL | 0 refills | Status: AC | PRN

## 2023-12-14 NOTE — Discharge Instructions (Signed)
 As discussed, we will try a medicine called indomethacin  to take for your gout flareup.  See information attached to your discharge papers regarding dietary changes to decrease likelihood of flareups.  Recommend follow-up with your primary care for reassessment.  Please do not hesitate to return if the worrisome signs and symptoms we discussed become apparent.

## 2023-12-14 NOTE — ED Notes (Signed)
 Pt alert and oriented X 4 at the time of discharge. RR even and unlabored. No acute distress noted. Pt verbalized understanding of discharge instructions as discussed. Pt ambulatory to lobby at time of discharge.

## 2023-12-14 NOTE — ED Provider Notes (Signed)
 [place] [facility] AT [facility] Provider Note   CSN: [id number] Arrival date & time: 12/14/23  1143     History  Chief Complaint  Patient presents with   Foot Pain    Kaitlin Haney is a 58 y.o. female.   Foot Pain   58 year old female presents emergency department once of right foot pain.  Is been present for the past couple of days.  History of gout and states this feels like a flareup.  Denies any known fall/trauma/injuries.  Reports pain to the outside of her foot without radiation.  Is on allopurinol for baseline management of gout.  States that over more weekend, did have increase in red meat since concerned that this may have "flared up her gout."  Denies any fevers, chills, weakness/sensory deficits in affected extremity.  Was unable to get appointment with primary care promptly presents to the emergency department.  Past medical history significant for gout, hypertension  Home Medications Prior to Admission medications   Medication Sig Start Date End Date Taking? Authorizing Provider  indomethacin  (INDOCIN ) 25 MG capsule Take 1 capsule (25 mg total) by mouth 3 (three) times daily as needed. 12/14/23  Yes Neil Balls A, PA  amLODipine  (NORVASC ) 5 MG tablet Take 1 tablet (5 mg total) by mouth daily. 07/07/19   Kelsey Patricia, MD  cyclobenzaprine  (FLEXERIL ) 10 MG tablet Take 1 tablet (10 mg total) by mouth 2 (two) times daily as needed for muscle spasms. 06/13/23   Curatolo, Adam, DO  lidocaine  (LIDODERM ) 5 % Place 1 patch onto the skin daily. Remove & Discard patch within 12 hours or as directed by MD 05/18/23   Scarlette Currier, MD  methocarbamol  (ROBAXIN ) 500 MG tablet Take 1 tablet (500 mg total) by mouth 2 (two) times daily. 11/08/23   Hayes Lipps, PA-C  methylPREDNISolone  (MEDROL  DOSEPAK) 4 MG TBPK tablet Follow package insert 06/13/23   Lowery Rue, DO      Allergies    Patient has no known allergies.    Review of Systems    Review of Systems  All other systems reviewed and are negative.   Physical Exam Updated Vital Signs BP 110/78   Pulse (!) 59   Temp 98.7 F (37.1 C) (Oral)   Resp 16   Wt (!) 144.2 kg   LMP 12/14/2015   SpO2 96%   BMI 46.96 kg/m  Physical Exam Vitals and nursing note reviewed.  Constitutional:      General: She is not in acute distress.    Appearance: She is well-developed.  HENT:     Head: Normocephalic and atraumatic.  Eyes:     Conjunctiva/sclera: Conjunctivae normal.  Cardiovascular:     Rate and Rhythm: Normal rate and regular rhythm.     Heart sounds: No murmur heard. Pulmonary:     Effort: Pulmonary effort is normal. No respiratory distress.     Breath sounds: Normal breath sounds.  Abdominal:     Palpations: Abdomen is soft.     Tenderness: There is no abdominal tenderness.  Musculoskeletal:        General: No swelling.     Cervical back: Neck supple.     Comments: Full range of motion ankle and digits of the right foot.  Patient with tender to palpation of the region ATFL as well as base of fifth metatarsal right foot.  Overlying swelling.  No obvious palpable fluctuance/induration.  Pedal and posterior tibial pulses 2+ bilaterally.  Skin:    General:  Skin is warm and dry.     Capillary Refill: Capillary refill takes less than 2 seconds.  Neurological:     Mental Status: She is alert.  Psychiatric:        Mood and Affect: Mood normal.     ED Results / Procedures / Treatments   Labs (all labs ordered are listed, but only abnormal results are displayed) Labs Reviewed - No data to display  EKG None  Radiology No results found.  Procedures Procedures    Medications Ordered in ED Medications  ketorolac  (TORADOL ) 30 MG/ML injection 30 mg (30 mg Intramuscular Given 12/14/23 1259)    ED Course/ Medical Decision Making/ A&P                                 Medical Decision Making Risk Prescription drug management.   This patient presents to  the ED for concern of right foot pain, this involves an extensive number of treatment options, and is a complaint that carries with it a high risk of complications and morbidity.  The differential diagnosis includes fracture, strain/sprain, dislocation, ligamentous/tendinous injury, neurovascular compromise, gout, septic arthritis, DVT, ischemic limb, other   Co morbidities that complicate the patient evaluation  See HPI   Additional history obtained:  Additional history obtained from EMR External records from outside source obtained and reviewed including hospital records   Lab Tests:  N/a   Imaging Studies ordered:  Patient declined x-ray imaging   Cardiac Monitoring: / EKG:  N/a   Consultations Obtained:  N/a   Problem List / ED Course / Critical interventions / Medication management  Right foot pain, history of gout I ordered medication including Toradol    Reevaluation of the patient after these medicines showed that the patient improved I have reviewed the patients home medicines and have made adjustments as needed   Social Determinants of Health:  Denies tobacco, illicit drug use.   Test / Admission - Considered:  Right foot pain, history of gout Vitals signs within normal range and stable throughout visit. 58 year old female presents emergency department once of right foot pain.  Is been present for the past couple of days.  History of gout and states this feels like a flareup.  Denies any known fall/trauma/injuries.  Reports pain to the outside of her foot without radiation.  Is on allopurinol for baseline management of gout.  States that over more weekend, did have increase in red meat since concerned that this may have "flared up her gout."  Denies any fevers, chills, weakness/sensory deficits in affected extremity.  Was unable to get appointment with primary care promptly presents to the emergency department. On exam, tenderness right sided ankle region  of ATFL as well as base of fifth metatarsal.  No pulse deficits suggest ischemic limb.  No overlying skin changes concerning for secondary infectious process.  Slight erythema but without significant tactile warmth; low suspicion for septic arthritis.  Patient is with history of gout and states this feels "the exact same."  Given patient's tenderness of right proximal metatarsal, offered x-ray imaging but patient declined.  She would prefer to be treated for gout and follow-up with her primary care.  States she has benefited from shot of Toradol  and sent home with indomethacin  in the past.  Will try similar medications.  Will recommend follow-up with PCP for reevaluation.  Treatment plan discussed with patient and she acknowledged understanding was agreeable to said  plan.  Patient overall well-appearing, afebrile in no acute distress. Worrisome signs and symptoms were discussed with the patient, and the patient acknowledged understanding to return to the ED if noticed. Patient was stable upon discharge.          Final Clinical Impression(s) / ED Diagnoses Final diagnoses:  Right foot pain  History of gout    Rx / DC Orders ED Discharge Orders          Ordered    indomethacin  (INDOCIN ) 25 MG capsule  3 times daily PRN        12/14/23 1248              [place], [place] 12/14/23 1308    Scarlette Currier, MD 12/15/23 [phone]

## 2023-12-14 NOTE — ED Triage Notes (Signed)
 Right foot pain x 2 days , Hx gout . On meds daily . Edema to side of foot .

## 2024-01-04 ENCOUNTER — Other Ambulatory Visit: Payer: Self-pay

## 2024-01-04 ENCOUNTER — Encounter (HOSPITAL_BASED_OUTPATIENT_CLINIC_OR_DEPARTMENT_OTHER): Payer: Self-pay | Admitting: Emergency Medicine

## 2024-01-04 ENCOUNTER — Emergency Department (HOSPITAL_BASED_OUTPATIENT_CLINIC_OR_DEPARTMENT_OTHER)
Admission: EM | Admit: 2024-01-04 | Discharge: 2024-01-04 | Disposition: A | Discharge status: Home / Self Care | Attending: Emergency Medicine | Admitting: Emergency Medicine

## 2024-01-04 DIAGNOSIS — M79672 Pain in left foot: Secondary | ICD-10-CM | POA: Diagnosis present

## 2024-01-04 DIAGNOSIS — M109 Gout, unspecified: Secondary | ICD-10-CM | POA: Insufficient documentation

## 2024-01-04 MED ORDER — KETOROLAC TROMETHAMINE 15 MG/ML IJ SOLN
15.0000 mg | Freq: Once | INTRAMUSCULAR | Status: AC
  Administered 2024-01-04: 15 mg via INTRAMUSCULAR
  Filled 2024-01-04: qty 1

## 2024-01-04 MED ORDER — PREDNISONE 20 MG PO TABS: 40.0000 mg | ORAL_TABLET | Freq: Every day | ORAL | 0 refills | Status: AC

## 2024-01-04 NOTE — ED Provider Notes (Signed)
 [place] EMERGENCY DEPARTMENT AT [facility] [place] Provider Note   CSN: [id number] Arrival date & time: 01/04/24  1911     Patient presents with: Foot Pain   Kaitlin Haney is a 58 y.o. female with extensive h/o gout presents to the ER today for evaluation of left foot/toe pain for the past few days. She reports the pain is consistent with her gout and denies any new injury. Denies any fever.  Foot Pain       Prior to Admission medications   Medication Sig Start Date End Date Taking? Authorizing Provider  predniSONE  (DELTASONE ) 20 MG tablet Take 2 tablets (40 mg total) by mouth daily for 5 days. 01/04/24 01/09/24 Yes Spence Dux, PA-C  amLODipine  (NORVASC ) 5 MG tablet Take 1 tablet (5 mg total) by mouth daily. 07/07/19   Kelsey Patricia, MD  cyclobenzaprine  (FLEXERIL ) 10 MG tablet Take 1 tablet (10 mg total) by mouth 2 (two) times daily as needed for muscle spasms. 06/13/23   Curatolo, Adam, DO  indomethacin  (INDOCIN ) 25 MG capsule Take 1 capsule (25 mg total) by mouth 3 (three) times daily as needed. 12/14/23   Neil Balls A, PA  lidocaine  (LIDODERM ) 5 % Place 1 patch onto the skin daily. Remove & Discard patch within 12 hours or as directed by MD 05/18/23   Scarlette Currier, MD  methocarbamol  (ROBAXIN ) 500 MG tablet Take 1 tablet (500 mg total) by mouth 2 (two) times daily. 11/08/23   Bauer, Collin S, PA-C    Allergies: Patient has no known allergies.    Review of Systems  Updated Vital Signs BP 111/70 (BP Location: Right Arm)   Pulse 77   Temp 98.6 F (37 C)   Resp 18   Ht 5' 9 (1.753 m)   Wt 132.9 kg   LMP 12/14/2015   SpO2 98%   BMI 43.27 kg/m   Physical Exam  (all labs ordered are listed, but only abnormal results are displayed) Labs Reviewed - No data to display  EKG: None  Radiology: No results found.  Procedures   Medications Ordered in the ED  ketorolac  (TORADOL ) 15 MG/ML injection 15 mg (15 mg Intramuscular Given 01/04/24 2052)                                 Medical Decision Making Risk Prescription drug management.   58 y.o. female presents to the ER today for evaluation of left foot pain. Differential diagnosis includes but is not limited to gout, cellulitis, septic arthritis, arthritis. Vital signs unremarkable. Physical exam as noted above.   Patient has well documented h/o gout. Was last treated three weeks ago. She recently had her uric acid tested and it has been increasing.   We discussed plan at bedside. We discussed strict return precautions and red flag symptoms. The patient verbalized their understanding and agrees to the plan. The patient is stable and being discharged home in good condition.  Portions of this report may have been transcribed using voice recognition software. Every effort was made to ensure accuracy; however, inadvertent computerized transcription errors may be present.   Final diagnoses:  Acute gout involving toe of left foot, unspecified cause    ED Discharge Orders          Ordered    predniSONE  (DELTASONE ) 20 MG tablet  Daily        01/04/24 2048

## 2024-01-04 NOTE — ED Triage Notes (Signed)
 Pt POV staggered gait- c/o L foot gout flare up since this AM.  Denies known injury.

## 2024-01-04 NOTE — Discharge Instructions (Addendum)
 You were seen in the ER today for evaluation of your gout. Make sure that you increase your dose of your allopurinol to 200mg  as recommended by your PCP. Please make sure you follow up with them as you may need adjustments to your medications. I am going to send you home on some prednisone  to help with your symptoms. Please take as prescribed. I have included information on gout and diet into the discharge paperwork for you to review. If you have any concerns, new or worsening symptoms, please return to the nearest ER for re-evaluation.   Contact a doctor if: You have another gout attack. You still have symptoms of a gout attack after 10 days of treatment. You have problems (side effects) because of your medicines. You have chills or a fever. You have burning pain when you pee (urinate). You have pain in your lower back or belly. Get help right away if: You have very bad pain. Your pain cannot be controlled. You cannot pee.

## 2024-03-12 NOTE — Progress Notes (Signed)
 Orthopedics Consult Note     Assessment:  Reviewed clinical exam, labs and discussed differential diagnosis for bilateral hand numbness and tingling including but not limited to carpal tunnel syndrome, cubital tunnel syndrome, cervical radiculopathy.  At the present time we discussed treatment options including immobilization medical management EMG nerve conduction studies present time she elected to proceed with     Plan:  1) EMG nerve conduction studies 2) continue gloves at night 3) gabapentin 300 mg at night 4) handout provided on carpal tunnel syndrome and cubital tunnel syndrome.     History of Present Illness:  Chief Complaint:  Bilateral hand pain, numbness and tingling  58 year old right hand dominant female who presents today with complaint of bilateral hand pain, numbness and tingling.  Patient denies any injury.  She states that symptoms have been present for a couple of years.  Patient reports pain level is 10 out of 10.  Pain is sharp dull stabbing throbbing burning in nature better with heat worse with cold.  Right hand is worse than the left.  She is dropping items.  Wakes up in the middle the night shaking her hand.  She has had no previous surgery on her hands.  Bracing at night has not helped.  She does wear gloves.  She works as a LAWYER.  All her fingers are going numb.    History and Physical standard intake form from today's or previous visit is reviewed, pertinent information is noted and otherwise scanned into the patient's permanent medical record for future use.  Allergies  Amlodipine    Medications    Current Medications[1]   Past Medical History  Medical History[2]   Past Surgical History  Surgical History[3]   Family History  Family History[4]   Social History:  Social History[5]   Review of Systems  Review of Systems  All other systems reviewed and are negative.      Vital Signs  BP 108/70   Pulse 65   Ht 1.753 m (5' 9)    Wt 136 kg (300 lb)   BMI 44.30 kg/m  Body mass index is 44.3 kg/m.  Labs: BUNs 14 creatinine is 1.01 GFR 65.  Physical Exam  Right hand: No edema, erythema, ecchymosis no open wounds no abrasions.  Sensations intact to light touch superficial radial median ulnar nerve distribution.  Abductor pollicis brevis 5\5 first dorsal interosseous 5\5 abductor digit minimi 5\5.  Tinel's positive Durkin's positive Phalen's positive full active range of motion radial pulses 2+.  Right thumb tender to palpation at the A1 pulley with triggering.  Left hand: No edema, erythema, ecchymosis.  No thenar or hypothenar intrinsic atrophy.  Sensations intact to light touch superficial radial median ulnar nerve distribution abductor pollicis brevis 5\5 first dorsal interosseous 5\5 abductor digit minimi 5\5.  Tinel's positive Durkin's positive Phalen's negative.  Full active range of motion radial pulse 2+.   XRays: Radiology Results (last 72 hours)     ** No results found for the last 72 hours. **          Problem List  Problem List[6]      Reda Elvie Hurst, CMA 03/12/2024 10:48 AM  Note - This record has been created using [employer]. Chart creation errors have been sought, but may not always have been located. Such creation errors do not reflect on the standard of medical care.         [1] Current Outpatient Medications  Medication Sig Dispense Refill  allopurinoL (ZYLOPRIM) 100 mg tablet Take 1 tablet by mouth daily.     cholecalciferol (VITAMIN D3) 1,250 mcg (50,000 unit) capsule TAKE 1 CAPSULE BY MOUTH ONCE A WEEK FOR 8 DOSES.     cyclobenzaprine  (FLEXERIL ) 10 mg tablet Take 10 mg by mouth.     hydroCHLOROthiazide (HYDRODIURIL) 50 mg tablet Take  by mouth.     No current facility-administered medications for this visit.  [2] Past Medical History: Diagnosis Date   Hypertension   [3] No past surgical history on file. [4] Family History Problem Relation Name Age  of Onset   Hypertension Mother     Hypertension Sister     Hypertension Brother    [5] Social History Socioeconomic History   Marital status: Single  Tobacco Use   Smoking status: Never   Smokeless tobacco: Never  Substance and Sexual Activity   Alcohol use: No   Drug use: No   Sexual activity: Not Currently   Social Drivers of Health   Food Insecurity: Not on File (04/14/2023)   Received from [facility]security    Food: 0  Transportation Needs: Not on File (09/22/2022)   Received from [facility] Needs    Transportation: 0  Safety: Low Risk  (11/08/2023)   Safety    How often does anyone, including family and friends, physically hurt you?: Never    How often does anyone, including family and friends, insult or talk down to you?: Never    How often does anyone, including family and friends, threaten you with harm?: Never    How often does anyone, including family and friends, scream or curse at you?: Never  Living Situation: Not on File (09/22/2022)   Received from [facility]    Housing: 0  [6] There is no problem list on file for this patient.

## 2024-03-21 NOTE — Progress Notes (Signed)
 CHIEF COMPLAINT: Upper extremity pain with weakness and numbness  HISTORY:    History of Present Illness The patient is a 58 year old female who presents for an electrodiagnostic consultation, referred by Dr. Anitra.  She has been experiencing pain and dysfunction in both hands and wrists, accompanied by numbness and tingling, for the past 1 to 2 years. The pain is described as a dull ache at the wrist, accompanied by numbness, tingling, weakness, and occasional dropping of objects. Increased pain is noted on the right side, leading to instances of dropping objects. She also experiences nocturnal dysesthesias bilaterally. Despite using compression gloves and wrist-hand orthoses, she has not found significant relief.   There is no neck pain with radicular pain or myelopathic symptoms. She has no history of diabetes, thyroid disease, cancer, or chemotherapy. There is no family history of neurologic disease. She has a history of repetitive work in [employer].  FAMILY HISTORY There is no family history of neurologic disease.    Please refer to the patient's intake form for specifics of the presenting complaints.  Past Medical History:  Medical History[1]  Past Surgical History:  Surgical History[2]  Allergies:  Allergies[3]  Medications: Current Medications[4]  Social History:  Social History[5]  Family History:  The patient's family history has been reviewed.  ROS:  A complete ROS was performed and was noted to be negative except that noted in HPI.  The following portions of the patient's history were reviewed and updated as appropriate: allergies, current medications, past family history, past medical history, past social history, past surgical history and problem list.  EXAM:   Vitals:   Vitals:   03/21/24 1407  BP: 118/78  Pulse: 68  Resp: 16  SpO2: 97%   General: NAD, A&Ox3, Good mood and affect Physical Exam Musculoskeletal - Upper extremities: Good  muscle tone with strength at 5/5 in deltoid, bicep, tricep, APB, FDI, and ADM - Wrists: Positive Tinel's sign bilaterally - Elbows: Negative Tinel's sign - Hands: Patient can make an okay sign bilaterally Other: Hoffman's sign is negative bilaterally    X-RAYS:  Results Diagnostic Testing  - Electrodiagnostic testing of both upper extremities: Severe median mononeuropathy at the right wrist and moderate median mononeuropathy at the left wrist, carpal tunnel syndrome. Mild ulnar mononeuropathies of both elbows. No electrodiagnostic evidence of a peripheral polyneuropathy in the upper extremities.      PROBLEM LIST: Problem List[6]  ASSESSMENT:  1. Carpal tunnel syndrome, bilateral      2. Ulnar neuropathy of both upper extremities         PLAN: Assessment & Plan 1. Peripheral nerve entrapments with median mononeuropathy and ulnar neuropathies bilaterally: - Experiencing pain and dysfunction of both hands and wrists with numbness and tingling for about 1 to 2 years. - Electrodiagnostic testing revealed severe median mononeuropathy at the right wrist and moderate median mononeuropathy at the left wrist, indicative of carpal tunnel syndrome. Additionally, there is mild ulnar mononeuropathy at both elbows. - Treatment options discussed include bracing and injections. Given the severity of her impingement, surgical decompression of her carpal tunnel syndrome is recommended. She may also consider addressing the ulnar impingement simultaneously. - Advised to avoid pressure on the elbows and hyperflexion at night, and to continue using wrist-hand orthoses and medications for pain control. 2.  Greater than 30 minutes was spent face-to-face counseling, coordinating and providing care today.        [1] Past Medical History: Diagnosis Date   Hypertension   [2] History  reviewed. No pertinent surgical history. [3] Allergies Allergen Reactions   Amlodipine  Other (See Comments)     Hair loss  [4] Current Outpatient Medications  Medication Sig Dispense Refill   allopurinoL (ZYLOPRIM) 100 mg tablet Take 1 tablet by mouth daily.     cholecalciferol (VITAMIN D3) 1,250 mcg (50,000 unit) capsule TAKE 1 CAPSULE BY MOUTH ONCE A WEEK FOR 8 DOSES.     cyclobenzaprine  (FLEXERIL ) 10 mg tablet Take 10 mg by mouth.     hydroCHLOROthiazide (HYDRODIURIL) 50 mg tablet Take  by mouth.     No current facility-administered medications for this visit.  [5] Social History Socioeconomic History   Marital status: Single  Tobacco Use   Smoking status: Never   Smokeless tobacco: Never  Substance and Sexual Activity   Alcohol use: No   Drug use: No   Sexual activity: Not Currently   Social Drivers of Health   Food Insecurity: Not on File (04/14/2023)   Received from [facility]    Food: 0  Transportation Needs: Not on File (09/22/2022)   Received from [facility]eds    Transportation: 0  Safety: Low Risk  (11/08/2023)   Safety    How often does anyone, including family and friends, physically hurt you?: Never    How often does anyone, including family and friends, insult or talk down to you?: Never    How often does anyone, including family and friends, threaten you with harm?: Never    How often does anyone, including family and friends, scream or curse at you?: Never  Living Situation: Not on File (09/22/2022)   Received from [facility] [place]    Housing: 0  [6] Patient Active Problem List Diagnosis   Carpal tunnel syndrome, bilateral   Ulnar neuropathy of both upper extremities

## 2024-07-04 ENCOUNTER — Encounter (HOSPITAL_BASED_OUTPATIENT_CLINIC_OR_DEPARTMENT_OTHER): Payer: Self-pay | Admitting: Emergency Medicine

## 2024-07-04 ENCOUNTER — Emergency Department (HOSPITAL_BASED_OUTPATIENT_CLINIC_OR_DEPARTMENT_OTHER): Admission: EM | Admit: 2024-07-04 | Discharge: 2024-07-04 | Disposition: A | Discharge status: Home / Self Care

## 2024-07-04 ENCOUNTER — Other Ambulatory Visit: Payer: Self-pay

## 2024-07-04 ENCOUNTER — Emergency Department (HOSPITAL_BASED_OUTPATIENT_CLINIC_OR_DEPARTMENT_OTHER)

## 2024-07-04 DIAGNOSIS — M79672 Pain in left foot: Secondary | ICD-10-CM | POA: Diagnosis present

## 2024-07-04 DIAGNOSIS — M109 Gout, unspecified: Secondary | ICD-10-CM | POA: Diagnosis not present

## 2024-07-04 MED ORDER — KETOROLAC TROMETHAMINE 60 MG/2ML IM SOLN
60.0000 mg | Freq: Once | INTRAMUSCULAR | Status: AC
  Administered 2024-07-04: 15:00:00 60 mg via INTRAMUSCULAR
  Filled 2024-07-04: qty 2

## 2024-07-04 NOTE — ED Provider Notes (Cosign Needed)
 [place] EMERGENCY DEPARTMENT AT [facility] [place] Provider Note   CSN: [id number] Arrival date & time: 07/04/24  1304     Patient presents with: Foot Pain   Kaitlin Haney is a 58 y.o. female.    Foot Pain   58 year old female presenting with foot pain.  Patient reports that she has a history of gout flares but has not had one since July of this year.  This has been going on for approximately 3 to 4 days.  She has tried using lidocaine  patches which helps with the pain and some but is still having some pain.  Patient states that she has been taking her medications like she should be.  She has not had any changes to her diet recently.  She has not had any falls or any trauma to the area.  She states that this feels exactly like it does whenever she has had other gout flares.  She reports that the majority of her pain is in her great toe of the left foot and around her medial malleolus.  She saw her PCP in September and had increased her dose of allopurinol to 300 mg twice a day.  She has not had any changes since then.  Her PCP note from that visit stated that her uric acid levels were still not within their goal.     Prior to Admission medications  Medication Sig Start Date End Date Taking? Authorizing Provider  amLODipine  (NORVASC ) 5 MG tablet Take 1 tablet (5 mg total) by mouth daily. 07/07/19   Griselda Norris, MD  cyclobenzaprine  (FLEXERIL ) 10 MG tablet Take 1 tablet (10 mg total) by mouth 2 (two) times daily as needed for muscle spasms. 06/13/23   Curatolo, Adam, DO  indomethacin  (INDOCIN ) 25 MG capsule Take 1 capsule (25 mg total) by mouth 3 (three) times daily as needed. 12/14/23   Silver Wonda LABOR, PA  lidocaine  (LIDODERM ) 5 % Place 1 patch onto the skin daily. Remove & Discard patch within 12 hours or as directed by MD 05/18/23   Dreama Longs, MD  methocarbamol  (ROBAXIN ) 500 MG tablet Take 1 tablet (500 mg total) by mouth 2 (two) times daily. 11/08/23   Bauer, Collin  S, PA-C    Allergies: Patient has no known allergies.    Review of Systems  All other systems reviewed and are negative.   Updated Vital Signs BP 121/77 (BP Location: Right Arm)   Pulse 67   Temp 97.7 F (36.5 C)   Resp 18   Ht 5' 9 (1.753 m)   Wt 134.7 kg   LMP 12/14/2015   SpO2 95%   BMI 43.86 kg/m   Physical Exam Vitals and nursing note reviewed.  Eyes:     General: No scleral icterus. Pulmonary:     Effort: Pulmonary effort is normal.  Musculoskeletal:       Feet:  Feet:     Comments: Patient had 2+ pedal pulses.  Patient was able to move all toes.  Patient had good sensation on both the dorsal and plantar surface of the foot.  Patient had good sensation in all toes.  Patient was able to move the ankle in all directions.  No increased heat was felt on the foot.  No deformities, abrasions, or lacerations were noted. Skin:    Coloration: Skin is not jaundiced.     Findings: No rash.  Neurological:     General: No focal deficit present.     Mental Status: She is  alert.     (all labs ordered are listed, but only abnormal results are displayed) Labs Reviewed - No data to display  EKG: None  Radiology: No results found.   Procedures   Medications Ordered in the ED  ketorolac  (TORADOL ) injection 60 mg (has no administration in time range)                                    Medical Decision Making Risk Prescription drug management.   Impression: 58 year old female with foot pain.  Differential diagnoses include gout flare, arthritis, septic arthritis, fracture, sprain  Additional History: Patient was able to provide history.  I reviewed previous ER visits and family practice visits.  Labs: No labs were ordered today.  Imaging: X-ray of the foot was performed and showed no acute fractures.  I reviewed all images.  ED Course/Meds: Patient received Toradol  for pain.  After review of x-ray no fractures were noted.  Patient is able to bear weight and  walk and there is no swelling making a sprain less likely.  There were also no signs of arthritis on x-ray.  Patient appears well and there are no signs of septic arthritis.  Patient was educated to follow-up with her PCP for possible change in her medication.  Patient was educated on red flag signs of when to return to the ER.      Final diagnoses:  None    ED Discharge Orders     None          Rosaline Almarie MATSU, PA-C 07/04/24 1518

## 2024-07-04 NOTE — Discharge Instructions (Signed)
 Begin taking steroid as prescribed.  Follow-up with your primary care if this continues.  If symptoms worsen or you start to have any fevers, redness, or cannot walk return to the ER.

## 2024-07-04 NOTE — ED Triage Notes (Signed)
 Pt c/o pain to LT foot x 2-3 days which she thinks is d/t gout
